# Patient Record
Sex: Female | Born: 1985
Health system: Southern US, Community
[De-identification: ages and names within clinical notes are randomized; demographics above are authoritative.]

## PROBLEM LIST (undated history)

## (undated) DIAGNOSIS — F32A Depression, unspecified: Secondary | ICD-10-CM

## (undated) DIAGNOSIS — K219 Gastro-esophageal reflux disease without esophagitis: Secondary | ICD-10-CM

## (undated) DIAGNOSIS — T8859XA Other complications of anesthesia, initial encounter: Secondary | ICD-10-CM

## (undated) DIAGNOSIS — F329 Major depressive disorder, single episode, unspecified: Secondary | ICD-10-CM

## (undated) DIAGNOSIS — Z8489 Family history of other specified conditions: Secondary | ICD-10-CM

## (undated) DIAGNOSIS — F419 Anxiety disorder, unspecified: Secondary | ICD-10-CM

## (undated) DIAGNOSIS — Z973 Presence of spectacles and contact lenses: Secondary | ICD-10-CM

## (undated) HISTORY — PX: WISDOM TOOTH EXTRACTION: SHX21

---
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
## 2008-05-31 ENCOUNTER — Emergency Department (HOSPITAL_COMMUNITY): Admission: EM | Admit: 2008-05-31 | Discharge: 2008-05-31 | Payer: Self-pay | Admitting: Family Medicine

## 2009-05-25 ENCOUNTER — Emergency Department (HOSPITAL_COMMUNITY): Admission: EM | Admit: 2009-05-25 | Discharge: 2009-05-25 | Payer: Self-pay | Admitting: Family Medicine

## 2010-10-15 ENCOUNTER — Inpatient Hospital Stay (INDEPENDENT_AMBULATORY_CARE_PROVIDER_SITE_OTHER)
Admission: RE | Admit: 2010-10-15 | Discharge: 2010-10-15 | Disposition: A | Discharge status: Home / Self Care | Payer: 59 | Source: Ambulatory Visit | Attending: Family Medicine | Admitting: Family Medicine

## 2010-10-15 DIAGNOSIS — J069 Acute upper respiratory infection, unspecified: Secondary | ICD-10-CM

## 2010-10-15 LAB — POCT RAPID STREP A (OFFICE): Streptococcus, Group A Screen (Direct): NEGATIVE

## 2010-11-27 LAB — URINE CULTURE

## 2010-11-27 LAB — POCT URINALYSIS DIP (DEVICE)
Bilirubin Urine: NEGATIVE
Glucose, UA: NEGATIVE mg/dL
Nitrite: NEGATIVE
Urobilinogen, UA: 0.2 mg/dL (ref 0.0–1.0)

## 2013-04-22 ENCOUNTER — Encounter (HOSPITAL_COMMUNITY): Payer: Self-pay

## 2013-04-22 ENCOUNTER — Emergency Department (HOSPITAL_COMMUNITY)
Admission: EM | Admit: 2013-04-22 | Discharge: 2013-04-22 | Disposition: A | Discharge status: Home / Self Care | Payer: 59 | Source: Home / Self Care | Attending: Emergency Medicine | Admitting: Emergency Medicine

## 2013-04-22 DIAGNOSIS — H6123 Impacted cerumen, bilateral: Secondary | ICD-10-CM

## 2013-04-22 DIAGNOSIS — H6120 Impacted cerumen, unspecified ear: Secondary | ICD-10-CM

## 2013-04-22 HISTORY — DX: Major depressive disorder, single episode, unspecified: F32.9

## 2013-04-22 HISTORY — DX: Depression, unspecified: F32.A

## 2013-04-22 NOTE — ED Provider Notes (Signed)
CSN: [id number]     Arrival date & time 04/22/13  1347 History   First MD Initiated Contact with Patient 04/22/13 1506     Chief Complaint  Patient presents with  . Cerumen Impaction   (Consider location/radiation/quality/duration/timing/severity/associated sxs/prior Treatment) The history is provided by the patient. No language interpreter was used.  C/O OF HEARING LOSS AND EAR STUFFINESS ON THE LEFT X SEVERAL DAYS HX OF CERUMEN IMPACTION IN THE PAST NO TRAUMA, NO FEVER  Past Medical History  Diagnosis Date  . Depression    History reviewed. No pertinent past surgical history. History reviewed. No pertinent family history. History  Substance Use Topics  . Smoking status: Current Some Day Smoker  . Smokeless tobacco: Not on file  . Alcohol Use: Yes   OB History   Grav Para Term Preterm Abortions TAB SAB Ect Mult Living                 Review of Systems  Constitutional: Negative.   HENT: Negative.        C/O LEFT EAR STUFFINESS AND HEARING LOSS  Eyes: Negative.   Respiratory: Negative.   Cardiovascular: Negative.   Gastrointestinal: Negative.   Endocrine: Negative.   Genitourinary: Negative.   Musculoskeletal: Negative.   Neurological: Negative.   Hematological: Negative.   Psychiatric/Behavioral: Negative.   All other systems reviewed and are negative.    Allergies  Review of patient's allergies indicates no known allergies.  Home Medications   Current Outpatient Rx  Name  Route  Sig  Dispense  Refill  . FLUoxetine (PROZAC) 20 MG capsule   Oral   Take 20 mg by mouth daily.          BP 117/77  Pulse 78  Temp(Src) 98.5 F (36.9 C) (Oral)  Resp 10  SpO2 98% Physical Exam  Nursing note and vitals reviewed. Constitutional: She is oriented to person, place, and time. She appears well-developed and well-nourished.  HENT:  Head: Normocephalic and atraumatic.  Mouth/Throat: Oropharynx is clear and moist.  LEFT EAR-NORMAL PINNA  IMPACTED CERUMEN RT EAR -PARTIAL CERUMEN IMPACTION  Eyes: Conjunctivae are normal. Pupils are equal, round, and reactive to light.  Neck: Normal range of motion. Neck supple.  Cardiovascular: Normal rate, regular rhythm, normal heart sounds and intact distal pulses.   No murmur heard. Pulmonary/Chest: Effort normal and breath sounds normal.  Abdominal: Soft. Bowel sounds are normal. She exhibits no distension and no mass. There is no tenderness.  Musculoskeletal: Normal range of motion.  Neurological: She is alert and oriented to person, place, and time. No cranial nerve deficit. She exhibits normal muscle tone. Coordination normal.  Skin: Skin is warm and dry.  Psychiatric: She has a normal mood and affect.    ED Course  Procedures (including critical care time) Labs Review Labs Reviewed - No data to display Imaging Review No results found.  MDM   1. Cerumen impaction, bilateral        Jani Files, MD 04/22/13 [id number]

## 2013-04-22 NOTE — ED Notes (Signed)
C/o hearing loss due to wax impaction ( visible on inspection)

## 2015-09-05 DIAGNOSIS — R87612 Low grade squamous intraepithelial lesion on cytologic smear of cervix (LGSIL): Secondary | ICD-10-CM | POA: Diagnosis not present

## 2015-10-04 DIAGNOSIS — Z13220 Encounter for screening for lipoid disorders: Secondary | ICD-10-CM | POA: Diagnosis not present

## 2015-10-04 DIAGNOSIS — F419 Anxiety disorder, unspecified: Secondary | ICD-10-CM | POA: Diagnosis not present

## 2015-10-04 DIAGNOSIS — Z0000 Encounter for general adult medical examination without abnormal findings: Secondary | ICD-10-CM | POA: Diagnosis not present

## 2015-10-04 DIAGNOSIS — Z808 Family history of malignant neoplasm of other organs or systems: Secondary | ICD-10-CM | POA: Diagnosis not present

## 2015-11-15 DIAGNOSIS — F419 Anxiety disorder, unspecified: Secondary | ICD-10-CM | POA: Diagnosis not present

## 2015-11-29 DIAGNOSIS — F419 Anxiety disorder, unspecified: Secondary | ICD-10-CM | POA: Diagnosis not present

## 2015-12-31 DIAGNOSIS — F419 Anxiety disorder, unspecified: Secondary | ICD-10-CM | POA: Diagnosis not present

## 2016-08-10 DIAGNOSIS — H9202 Otalgia, left ear: Secondary | ICD-10-CM | POA: Diagnosis not present

## 2016-10-06 DIAGNOSIS — Z0000 Encounter for general adult medical examination without abnormal findings: Secondary | ICD-10-CM | POA: Diagnosis not present

## 2016-10-06 DIAGNOSIS — F419 Anxiety disorder, unspecified: Secondary | ICD-10-CM | POA: Diagnosis not present

## 2016-10-06 DIAGNOSIS — Z13220 Encounter for screening for lipoid disorders: Secondary | ICD-10-CM | POA: Diagnosis not present

## 2016-10-28 DIAGNOSIS — H52223 Regular astigmatism, bilateral: Secondary | ICD-10-CM | POA: Diagnosis not present

## 2016-10-28 DIAGNOSIS — H5213 Myopia, bilateral: Secondary | ICD-10-CM | POA: Diagnosis not present

## 2016-11-19 MED FILL — SERTRALINE HCL 50 MG TABLET: 50 | 30 days supply | Qty: 30 | Fill #0

## 2016-12-03 DIAGNOSIS — Z6820 Body mass index (BMI) 20.0-20.9, adult: Secondary | ICD-10-CM | POA: Diagnosis not present

## 2016-12-03 DIAGNOSIS — Z01419 Encounter for gynecological examination (general) (routine) without abnormal findings: Secondary | ICD-10-CM | POA: Diagnosis not present

## 2016-12-03 MED FILL — TRI-PREVIFEM TABLET: 0.18/0.215/ | 28 days supply | Qty: 28 | Fill #0

## 2016-12-17 MED FILL — SERTRALINE HCL 50 MG TABLET: 50 | 30 days supply | Qty: 30 | Fill #1

## 2017-01-01 MED FILL — TRI-PREVIFEM TABLET: 0.18/0.215/ | 84 days supply | Qty: 84 | Fill #1

## 2017-01-12 MED FILL — NEO/POLYMYXIN/DEXAMETH DROP: 3.5-10000-0 | 10 days supply | Qty: 5 | Fill #0

## 2017-01-12 MED FILL — SERTRALINE HCL 50 MG TABLET: 50 | 90 days supply | Qty: 90 | Fill #2

## 2017-03-25 MED FILL — LORazepam 0.5 MG TABS: 0.5 | 30 days supply | Qty: 30 | Fill #0

## 2017-04-02 MED FILL — SERTRALINE HCL 50 MG TABLET: 50 | 90 days supply | Qty: 90 | Fill #0

## 2017-04-02 MED FILL — TRI-PREVIFEM TABLET: 0.18/0.215/ | 84 days supply | Qty: 84 | Fill #2

## 2017-05-13 ENCOUNTER — Other Ambulatory Visit: Payer: Self-pay | Admitting: Occupational Medicine

## 2017-05-13 ENCOUNTER — Ambulatory Visit: Payer: Self-pay

## 2017-05-13 DIAGNOSIS — M542 Cervicalgia: Secondary | ICD-10-CM

## 2017-07-06 ENCOUNTER — Other Ambulatory Visit: Payer: Self-pay | Admitting: Family Medicine

## 2017-07-06 ENCOUNTER — Ambulatory Visit
Admission: RE | Admit: 2017-07-06 | Discharge: 2017-07-06 | Disposition: A | Discharge status: Home / Self Care | Payer: 59 | Source: Ambulatory Visit | Attending: Family Medicine | Admitting: Family Medicine

## 2017-07-06 DIAGNOSIS — M79671 Pain in right foot: Secondary | ICD-10-CM

## 2017-07-06 MED FILL — NORG-EE 0.18-0.215-0.25/0.0: 0.18/0.215/ | 84 days supply | Qty: 84 | Fill #3

## 2017-07-08 ENCOUNTER — Encounter (INDEPENDENT_AMBULATORY_CARE_PROVIDER_SITE_OTHER): Payer: Self-pay | Admitting: Orthopedic Surgery

## 2017-07-08 ENCOUNTER — Ambulatory Visit (INDEPENDENT_AMBULATORY_CARE_PROVIDER_SITE_OTHER): Payer: 59 | Admitting: Orthopedic Surgery

## 2017-07-08 DIAGNOSIS — M79671 Pain in right foot: Secondary | ICD-10-CM | POA: Diagnosis not present

## 2017-07-08 NOTE — Progress Notes (Signed)
   Office Visit Note   Patient: Patricia Knight           Date of Birth: November 28, 1985           MRN: [id number] Visit Date: 07/08/2017              Requested by: Deatra JamesSun, Vyvyan, MD [phone] [address] [place], [place] [address] PCP: Deatra JamesSun, Vyvyan, MD  No chief complaint on file.     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  Patient does work 12-hour shifts on her foot she denies any specific injury.  She did have radiographs obtained on 07/06/2017 these are reviewed which shows no fractures.  Patient is currently in a pair of soft sneakers and presents for initial evaluation.  Assessment & Plan: Visit Diagnoses:  1. Pain in right foot   Stress fracture base of the fourth metatarsal right foot.  Plan: Recommended a stiff soled sneaker that does not flex.  Recommended out of work for 1 week and out of exercise and training for 3 weeks follow-up in 3 weeks with 3 view radiographs of the right foot at follow-up.  Follow-Up Instructions: Return in about 3 weeks (around 07/29/2017).   Ortho Exam  Patient is alert, oriented, no adenopathy, well-dressed, normal affect, normal respiratory effort. Examination patient has a good dorsalis pedis pulse she has good ankle good subtalar motion.  The metatarsal heads are nontender to palpation the web spaces are nontender to palpation she is point tender to palpation over the base of the fourth metatarsal.  Radiographs are reviewed which shows no fracture no cystic changes.  Imaging: No results found. No images are attached to the encounter.  Labs: Lab Results  Component Value Date   REPTSTATUS 05/28/2009 FINAL 05/25/2009   CULT ESCHERICHIA COLI 05/25/2009   LABORGA ESCHERICHIA COLI 05/25/2009    Orders:  No orders of the defined types were placed in this encounter.  No orders of the  defined types were placed in this encounter.    Procedures: No procedures performed  Clinical Data: No additional findings.  ROS:  All other systems negative, except as noted in the HPI. Review of Systems  Objective: Vital Signs: LMP 06/13/2017   Specialty Comments:  No specialty comments available.  PMFS History: There are no active problems to display for this patient.  Past Medical History:  Diagnosis Date  . Depression     History reviewed. No pertinent family history.  History reviewed. No pertinent surgical history. Social History   Occupational History  . Not on file  Tobacco Use  . Smoking status: Current Some Day Smoker  Substance and Sexual Activity  . Alcohol use: Yes  . Drug use: No  . Sexual activity: Yes    Birth control/protection: Pill

## 2017-07-21 MED FILL — SERTRALINE HCL 50 MG TABLET: 50 | 90 days supply | Qty: 90 | Fill #0

## 2017-08-03 ENCOUNTER — Ambulatory Visit (INDEPENDENT_AMBULATORY_CARE_PROVIDER_SITE_OTHER): Payer: 59 | Admitting: Orthopedic Surgery

## 2017-08-03 ENCOUNTER — Encounter (INDEPENDENT_AMBULATORY_CARE_PROVIDER_SITE_OTHER): Payer: Self-pay | Admitting: Orthopedic Surgery

## 2017-08-03 ENCOUNTER — Ambulatory Visit (INDEPENDENT_AMBULATORY_CARE_PROVIDER_SITE_OTHER): Payer: 59

## 2017-08-03 DIAGNOSIS — S92344D Nondisplaced fracture of fourth metatarsal bone, right foot, subsequent encounter for fracture with routine healing: Secondary | ICD-10-CM

## 2017-08-03 NOTE — Progress Notes (Signed)
   Office Visit Note   Patient: Patricia Knight           Date of Birth: [birth year]           MRN: [id number] Visit Date: 08/03/2017              Requested by: Deatra JamesSun, Vyvyan, MD [phone] [address] [place], [place] [address] PCP: Deatra JamesSun, Vyvyan, MD  Chief Complaint  Patient presents with  . Right Foot - Follow-up      HPI: Patient presents in follow-up for stress fracture of fourth metatarsal right foot.  She has been in a fracture boot for about 2 weeks she states she is symptomatically much better she states she could not tolerate a stiff soled shoe.  She states her symptoms are worse after working a 12-hour shift.  She states she is much better now than she was previously.  Assessment & Plan: Visit Diagnoses:  1. Closed nondisplaced fracture of fourth metatarsal bone of right foot with routine healing, subsequent encounter     Plan: Patient will wear the fracture boot for 2 more weeks and then advance her activities as tolerated with nonimpact exercises such as elliptical or cycling.  If she develops symptoms at that time she will decrease her activities as per her symptoms.  Follow-Up Instructions: Return if symptoms worsen or fail to improve.   Ortho Exam  Patient is alert, oriented, no adenopathy, well-dressed, normal affect, normal respiratory effort. Examination patient has no redness no swelling no signs of infection in the foot.  She has good pulses.  She has no tenderness to palpation over the metatarsals the web spaces are nontender no evidence of a neuroma.  No ecchymosis no bruising no skin color or temperature changes.  Imaging: Xr Foot Complete Right  Result Date: 08/03/2017 3 view radiographs of the right foot shows normal bony alignment no callus formation no displaced fractures.  No images are attached to the encounter.  Labs: Lab Results  Component Value Date   REPTSTATUS 05/28/2009 FINAL 05/25/2009   CULT ESCHERICHIA COLI 05/25/2009   LABORGA  ESCHERICHIA COLI 05/25/2009    @LABSALLVALUES (HGBA1)@  There is no height or weight on file to calculate BMI.  Orders:  Orders Placed This Encounter  Procedures  . XR Foot Complete Right   No orders of the defined types were placed in this encounter.    Procedures: No procedures performed  Clinical Data: No additional findings.  ROS:  All other systems negative, except as noted in the HPI. Review of Systems  Objective: Vital Signs: There were no vitals taken for this visit.  Specialty Comments:  No specialty comments available.  PMFS History: There are no active problems to display for this patient.  Past Medical History:  Diagnosis Date  . Depression     History reviewed. No pertinent family history.  History reviewed. No pertinent surgical history. Social History   Occupational History  . Not on file  Tobacco Use  . Smoking status: Current Some Day Smoker  . Smokeless tobacco: Never Used  Substance and Sexual Activity  . Alcohol use: Yes  . Drug use: No  . Sexual activity: Yes    Birth control/protection: Pill

## 2017-09-29 MED FILL — OSELTAMIVIR PHOSPHATE 75 MG: 75 | 5 days supply | Qty: 10 | Fill #0

## 2017-09-29 MED FILL — NORG-EE 0.18-0.215-0.25/0.0: 0.18/0.215/ | 84 days supply | Qty: 84 | Fill #4

## 2017-10-08 MED FILL — metroNIDAZOLE 0.75 % CREA: 0.75 | 20 days supply | Qty: 45 | Fill #0

## 2017-10-08 MED FILL — SERTRALINE HCL 50 MG TABLET: 50 | 90 days supply | Qty: 90 | Fill #0

## 2017-11-05 ENCOUNTER — Encounter: Payer: Self-pay | Admitting: Podiatry

## 2017-11-05 ENCOUNTER — Ambulatory Visit: Payer: No Typology Code available for payment source | Admitting: Podiatry

## 2017-11-05 ENCOUNTER — Ambulatory Visit (INDEPENDENT_AMBULATORY_CARE_PROVIDER_SITE_OTHER): Payer: No Typology Code available for payment source

## 2017-11-05 VITALS — BP 127/88 | HR 65 | Resp 16

## 2017-11-05 DIAGNOSIS — M7751 Other enthesopathy of right foot: Secondary | ICD-10-CM

## 2017-11-05 DIAGNOSIS — M779 Enthesopathy, unspecified: Secondary | ICD-10-CM | POA: Diagnosis not present

## 2017-11-05 DIAGNOSIS — M778 Other enthesopathies, not elsewhere classified: Secondary | ICD-10-CM

## 2017-11-05 MED ORDER — TRIAMCINOLONE ACETONIDE 10 MG/ML IJ SUSP
10.0000 mg | Freq: Once | INTRAMUSCULAR | Status: AC
  Administered 2017-11-05: 10 mg

## 2017-11-05 NOTE — Progress Notes (Signed)
Subjective:   Patient ID: Patricia Knight, female   DOB: 32 y.o.   MRN: [id number]   HPI Patient presents stating that she developed a lot of pain in her forefoot right and she does not remember specific injury but is been going on for around 4 months.  Patient was given a tentative diagnosis of stress fracture walker boot for 8 weeks but that has not improved problems and it now hurts in her ankle.  Patient does not smoke and likes to be active   Review of Systems  All other systems reviewed and are negative.       Objective:  Physical Exam  Constitutional: She appears well-developed and well-nourished.  Cardiovascular: Intact distal pulses.  Pulmonary/Chest: Effort normal.  Musculoskeletal: Normal range of motion.  Neurological: She is alert.  Skin: Skin is warm.  Nursing note and vitals reviewed.   Neurovascular status intact muscle strength adequate range of motion within normal limits with exquisite discomfort in the mid foot right within the third intermetatarsal space and around the third and fourth metatarsals.  It is localized to this area with exquisite discomfort also noted in the sinus tarsi right and no indications of flatfoot deformity or other structural pathology.  Patient has good digital perfusion and is well oriented x3     Assessment:  Appears to be an inflammatory condition that is most likely soft tissue versus versus bone.  Patient does have an unusual area that it hurts but it is quite tender in this area indicative of an inflammatory condition     Plan:  H&P x-rays reviewed and today careful midfoot injection administered 3 mg Kenalog 5 mg Xylocaine and sinus tarsi injection administered 3 mg Kenalog 5 mg Xylocaine and discussed that if this does not improve we will need to get MRI at this to evaluate whether or not there is another condition occurring.  Patient also was placed in fascial brace to lift the arch and will be evaluated in the next several  weeks  X-rays were negative for signs of fracture and it appears to be some kind of a soft tissue condition

## 2017-11-22 MED FILL — metroNIDAZOLE 0.75 % CREA: 0.75 | 20 days supply | Qty: 45 | Fill #0

## 2017-11-24 ENCOUNTER — Encounter: Payer: Self-pay | Admitting: Podiatry

## 2017-11-24 ENCOUNTER — Ambulatory Visit: Payer: No Typology Code available for payment source | Admitting: Podiatry

## 2017-11-24 ENCOUNTER — Telehealth: Payer: Self-pay | Admitting: Podiatry

## 2017-11-24 DIAGNOSIS — M779 Enthesopathy, unspecified: Secondary | ICD-10-CM | POA: Diagnosis not present

## 2017-11-24 NOTE — Progress Notes (Signed)
Subjective:   Patient ID: Patricia Knight, female   DOB: 32 y.o.   MRN: [id number]   HPI Patient states that she is improved and states that she still has some discomfort in the area and also that her toes turn colors quite frequently   ROS      Objective:  Physical Exam  Neurovascular status intact with patient found to have possibility for ray nods type phenomena bilateral and is noted to have mild to moderate discomfort still in the forefoot right foot     Assessment:  Improving right foot with possibility for mild chronic pain syndrome and also ray nods may be present     Plan:  With patient today to be low at 10 history of present recommended long-term orthotics and I did refer to ped orthotist for this procedure.  I explained that I want them to be a softer type device due to her activity levels and will have those made and also due to the discomfort and the possibility for chronic pain syndrome I am sending her to physical therapy for e-stim iontophoresis and joint mobilization to try to continue to improve the condition she is experiencing.  Spent a great deal time educating her on this

## 2017-11-24 NOTE — Telephone Encounter (Signed)
Left voicemail for pt to call to discuss appt for 4.10.19 for custom orthotics.Not covered under focus plan.

## 2017-11-25 NOTE — Addendum Note (Signed)
Addended by: Alphia Kava'CONNELL, VALERY D on: 11/25/2017 06:05 PM   Modules accepted: Orders

## 2017-12-01 ENCOUNTER — Ambulatory Visit (INDEPENDENT_AMBULATORY_CARE_PROVIDER_SITE_OTHER): Payer: No Typology Code available for payment source | Admitting: Orthotics

## 2017-12-01 DIAGNOSIS — M779 Enthesopathy, unspecified: Secondary | ICD-10-CM

## 2017-12-01 DIAGNOSIS — M778 Other enthesopathies, not elsewhere classified: Secondary | ICD-10-CM

## 2017-12-01 NOTE — Progress Notes (Signed)
Patient came into today to be cast for Custom Foot Orthotics. Upon recommendation of Dr. 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

## 2017-12-23 ENCOUNTER — Ambulatory Visit: Payer: No Typology Code available for payment source | Admitting: Orthotics

## 2017-12-23 DIAGNOSIS — M779 Enthesopathy, unspecified: Secondary | ICD-10-CM

## 2017-12-23 DIAGNOSIS — M778 Other enthesopathies, not elsewhere classified: Secondary | ICD-10-CM

## 2017-12-23 NOTE — Progress Notes (Signed)
Patient came in today to pick up custom made foot orthotics.  The goals were accomplished and the patient reported no dissatisfaction with said orthotics.  Patient was advised of breakin period and how to report any issues. 

## 2017-12-30 MED FILL — metroNIDAZOLE 0.75 % CREA: 0.75 | 20 days supply | Qty: 45 | Fill #1

## 2018-01-06 MED FILL — FEMYNOR 0.25-35 MG-MCG TABS: 0.25-35 | 21 days supply | Qty: 28 | Fill #0

## 2018-01-07 MED FILL — SERTRALINE HCL 50 MG TABLET: 50 | 90 days supply | Qty: 90 | Fill #1

## 2018-01-31 MED FILL — FEMYNOR 0.25-35 MG-MCG TABS: 0.25-35 | 84 days supply | Qty: 112 | Fill #1

## 2018-03-09 ENCOUNTER — Ambulatory Visit (INDEPENDENT_AMBULATORY_CARE_PROVIDER_SITE_OTHER): Payer: Self-pay | Admitting: Nurse Practitioner

## 2018-03-09 VITALS — BP 105/65 | HR 67 | Temp 98.5°F | Resp 16 | Wt 124.2 lb

## 2018-03-09 DIAGNOSIS — L259 Unspecified contact dermatitis, unspecified cause: Secondary | ICD-10-CM

## 2018-03-09 MED ORDER — HYDROCORTISONE 2.5 % EX OINT: TOPICAL_OINTMENT | Freq: Two times a day (BID) | CUTANEOUS | 0 refills | Status: DC

## 2018-03-09 MED ORDER — PREDNISONE 10 MG (21) PO TBPK: ORAL_TABLET | ORAL | 0 refills | Status: AC

## 2018-03-09 NOTE — Patient Instructions (Signed)
Contact Dermatitis  Take Pepcid AC 20mg  daily until symptoms improve Take Zyrtec 10mg  daily in the morning until symptoms improve. Take Benadryl 25mg  at bedtime until symptoms improve.  Dermatitis is redness, soreness, and swelling (inflammation) of the skin. Contact dermatitis is a reaction to certain substances that touch the skin. There are two types of contact dermatitis:  Irritant contact dermatitis. This type is caused by something that irritates your skin, such as dry hands from washing them too much. This type does not require previous exposure to the substance for a reaction to occur. This type is more common.  Allergic contact dermatitis. This type is caused by a substance that you are allergic to, such as a nickel allergy or poison ivy. This type only occurs if you have been exposed to the substance (allergen) before. Upon a repeat exposure, your body reacts to the substance. This type is less common.  What are the causes? Many different substances can cause contact dermatitis. Irritant contact dermatitis is most commonly caused by exposure to:  Makeup.  Soaps.  Detergents.  Bleaches.  Acids.  Metal salts, such as nickel.  Allergic contact dermatitis is most commonly caused by exposure to:  Poisonous plants.  Chemicals.  Jewelry.  Latex.  Medicines.  Preservatives in products, such as clothing.  What increases the risk? This condition is more likely to develop in:  People who have jobs that expose them to irritants or allergens.  People who have certain medical conditions, such as asthma or eczema.  What are the signs or symptoms? Symptoms of this condition may occur anywhere on your body where the irritant has touched you or is touched by you. Symptoms include:  Dryness or flaking.  Redness.  Cracks.  Itching.  Pain or a burning feeling.  Blisters.  Drainage of small amounts of blood or clear fluid from skin cracks.  With allergic contact  dermatitis, there may also be swelling in areas such as the eyelids, mouth, or genitals. How is this diagnosed? This condition is diagnosed with a medical history and physical exam. A patch skin test may be performed to help determine the cause. If the condition is related to your job, you may need to see an occupational medicine specialist. How is this treated? Treatment for this condition includes figuring out what caused the reaction and protecting your skin from further contact. Treatment may also include:  Steroid creams or ointments. Oral steroid medicines may be needed in more severe cases.  Antibiotics or antibacterial ointments, if a skin infection is present.  Antihistamine lotion or an antihistamine taken by mouth to ease itching.  A bandage (dressing).  Follow these instructions at home: Skin Care  Moisturize your skin as needed.  Apply cool compresses to the affected areas.  Try taking a bath with: ? Epsom salts. Follow the instructions on the packaging. You can get these at your local pharmacy or grocery store. ? Baking soda. Pour a small amount into the bath as directed by your health care provider. ? Colloidal oatmeal. Follow the instructions on the packaging. You can get this at your local pharmacy or grocery store.  Try applying baking soda paste to your skin. Stir water into baking soda until it reaches a paste-like consistency.  Do not scratch your skin.  Bathe less frequently, such as every other day.  Bathe in lukewarm water. Avoid using hot water. Medicines  Take or apply over-the-counter and prescription medicines only as told by your health care provider.  If  you were prescribed an antibiotic medicine, take or apply your antibiotic as told by your health care provider. Do not stop using the antibiotic even if your condition starts to improve. General instructions  Keep all follow-up visits as told by your health care provider. This is  important.  Avoid the substance that caused your reaction. If you do not know what caused it, keep a journal to try to track what caused it. Write down: ? What you eat. ? What cosmetic products you use. ? What you drink. ? What you wear in the affected area. This includes jewelry.  If you were given a dressing, take care of it as told by your health care provider. This includes when to change and remove it. Contact a health care provider if:  Your condition does not improve with treatment.  Your condition gets worse.  You have signs of infection such as swelling, tenderness, redness, soreness, or warmth in the affected area.  You have a fever.  You have new symptoms. Get help right away if:  You have a severe headache, neck pain, or neck stiffness.  You vomit.  You feel very sleepy.  You notice red streaks coming from the affected area.  Your bone or joint underneath the affected area becomes painful after the skin has healed.  The affected area turns darker.  You have difficulty breathing. This information is not intended to replace advice given to you by your health care provider. Make sure you discuss any questions you have with your health care provider. Document Released: 08/07/2000 Document Revised: 01/16/2016 Document Reviewed: 12/26/2014 Elsevier Interactive Patient Education  2018 [employer].

## 2018-03-09 NOTE — Progress Notes (Signed)
Subjective:     Sampson Goonina E Delsanto is a 32 y.o. female who presents for evaluation of a rash involving the chest and neck, behind her ears and bilateral forearms. Rash started 1 day ago. Lesions are erythematous, and raised in texture. Rash has changed over time. Rash is pruritic. Associated symptoms: none. Patient denies: abdominal pain, cough, fever, headache, irritability, myalgia, nausea, sore throat and vomiting. Patient has not had contacts with similar rash. Patient has had new exposures (soaps, lotions, laundry detergents, foods, medications, plants, insects or animals).  The only thing that she has changed over the last couple of days is that she and her husband purchased a new hot tub and suspects the chemicals in the water may have caused the rash.  She has not taken anything for the rash at this time.  The following portions of the patient's history were reviewed and updated as appropriate: allergies, current medications and past medical history.  Review of Systems Constitutional: negative Ears, nose, mouth, throat, and face: negative Respiratory: negative Cardiovascular: negative Integument/breast: positive for pruritus, rash and skin color change, negative for skin lesion(s)    Objective:    BP 105/65 (BP Location: Right Arm, Patient Position: Sitting, Cuff Size: Normal)   Pulse 67   Temp 98.5 F (36.9 C)   Resp 16   Wt 124 lb 3.2 oz (56.3 kg)   SpO2 98%  General:  alert, cooperative, mild distress and due to itching  Skin:  maculopapular rash to chest, arms, neck and behind ears, rash is erythematous, raised, no drainage     Assessment:    contact dermatitis: Unknown Agent    Plan:   Exam findings, diagnosis etiology and medication use and indications reviewed with patient. Follow- Up and discharge instructions provided. No emergent/urgent issues found on exam.  Patient verbalized understanding of information provided and agrees with plan of care (POC), all questions  answered.  Contact Dermatitis, Unknown Agent -Take Pepcid AC 20mg  daily until symptoms improve -Take Zyrtec 10mg  daily in the morning until symptoms improve. -Take Benadryl 25mg  at bedtime until symptoms improve. -Sterapred Dose Pack as directed. -Hydrocortisone cream as directed. -Use Aveeno Colloidal Oatmeal bath until symptoms improve. 1. Contact dermatitis, unspecified contact dermatitis type, unspecified trigger  - predniSONE (STERAPRED UNI-PAK 21 TAB) 10 MG (21) TBPK tablet; Take as directed.  Dispense: 21 tablet; Refill: 0 - hydrocortisone 2.5 % ointment; Apply topically 2 (two) times daily. Apply to affected areas twice daily until symptoms improve.  Dispense: 28.35 g; Refill: 0

## 2018-05-03 MED FILL — SERTRALINE HCL 50 MG TABLET: 50 | 90 days supply | Qty: 90 | Fill #2

## 2018-05-04 MED FILL — FOLIC ACID 1 MG TABS: 1 | 90 days supply | Qty: 90 | Fill #0

## 2018-06-03 ENCOUNTER — Ambulatory Visit: Payer: No Typology Code available for payment source | Admitting: Podiatry

## 2018-06-03 ENCOUNTER — Encounter: Payer: Self-pay | Admitting: Podiatry

## 2018-06-03 ENCOUNTER — Ambulatory Visit: Payer: No Typology Code available for payment source

## 2018-06-03 DIAGNOSIS — M779 Enthesopathy, unspecified: Secondary | ICD-10-CM | POA: Diagnosis not present

## 2018-06-03 DIAGNOSIS — M778 Other enthesopathies, not elsewhere classified: Secondary | ICD-10-CM

## 2018-06-03 DIAGNOSIS — G894 Chronic pain syndrome: Secondary | ICD-10-CM

## 2018-06-03 DIAGNOSIS — M79671 Pain in right foot: Secondary | ICD-10-CM

## 2018-06-03 MED ORDER — TRIAMCINOLONE ACETONIDE 10 MG/ML IJ SUSP
10.0000 mg | Freq: Once | INTRAMUSCULAR | Status: AC
  Administered 2018-06-03: 10 mg

## 2018-06-03 MED ORDER — GABAPENTIN 100 MG PO CAPS: 100.0000 mg | ORAL_CAPSULE | Freq: Three times a day (TID) | ORAL | 3 refills | Status: DC

## 2018-06-03 NOTE — Progress Notes (Signed)
Subjective:   Patient ID: Patricia Knight, female   DOB: 32 y.o.   MRN: [id number]   HPI Patient states that last weekend she started to develop pain again and its in the top of the foot and she is trying to get pregnant most likely is not but is due to get her cycle in the next few days.  Patient stated for the most part her foot was feeling better over this last 6 months   ROS      Objective:  Physical Exam  Neurovascular status intact with still some coolness to the foot in general with mild mottled-like appearance and discomfort dorsum of the right foot     Assessment:  Cannot rule out chronic pain syndrome versus possibility for tendinitis-like symptoms or other soft tissue bone structural deformity     Plan:  H&P condition reviewed and careful dorsal injection administered 3 mg Kenalog 5 g Xylocaine and we will try a topical neuropathic type cream and I did discuss possible gabapentin assuming she is not pregnant and we will see her back 5 weeks and reevaluate

## 2018-06-06 ENCOUNTER — Telehealth: Payer: Self-pay | Admitting: Podiatry

## 2018-06-06 NOTE — Telephone Encounter (Addendum)
Pt states her ob-gyn states she can not use the gabapentin or the topical. Pt states she is trying to get pregnant. I told pt that she could use ice therapy 15-20 minutes/sessions 3-4 times a day protecting the skin from the ice with a light cloth. Pt states understanding. Pt states the right orthotics is fine and she can not feel in her shoe, but the left she can feel in the shoe and she was told that she could come in to get it adjusted. I told pt I would transfer to [facility] - Music therapist and she would help her with a good time to come in.

## 2018-06-06 NOTE — Telephone Encounter (Signed)
Pt. Called to say, shes unable to take either medication you prescribed. My foot feels about 90% better just from the steroid injection.

## 2018-06-13 ENCOUNTER — Ambulatory Visit: Payer: No Typology Code available for payment source | Admitting: Orthotics

## 2018-06-13 DIAGNOSIS — M779 Enthesopathy, unspecified: Secondary | ICD-10-CM

## 2018-06-13 DIAGNOSIS — M778 Other enthesopathies, not elsewhere classified: Secondary | ICD-10-CM

## 2018-06-13 NOTE — Progress Notes (Signed)
Made adjustment to RT foot orthotic...lowered arch manually with heat/and cool air.  Patietn was pleased w/ result.

## 2018-07-01 ENCOUNTER — Encounter: Payer: Self-pay | Admitting: Podiatry

## 2018-07-08 ENCOUNTER — Ambulatory Visit: Payer: No Typology Code available for payment source | Admitting: Podiatry

## 2018-08-19 MED FILL — FOLIC ACID 1 MG TABS: 1 | 90 days supply | Qty: 90 | Fill #1

## 2018-08-19 MED FILL — SERTRALINE HCL 50 MG TABLET: 50 | 90 days supply | Qty: 90 | Fill #3

## 2018-08-19 MED FILL — metroNIDAZOLE 0.75 % CREA: 0.75 | 20 days supply | Qty: 45 | Fill #2

## 2018-08-24 NOTE — L&D Delivery Note (Signed)
Delivery Note At 2:09 PM Patricia viable and healthy female was delivered via Vaginal, Spontaneous (Presentation:vtx roa ;  ).  APGAR: 8, 9; weight  pending.   Placenta status: spontaneous, intact not sent, .  Cord: n/Patricia with the following complications: .  Cord pH: n/Patricia  Anesthesia:  Epidural, local Episiotomy: None Lacerations:  Left vaginal Sulcus;Perineal;2nd degree Suture Repair: 3.0 chromic Est. Blood Loss (mL): 116  Mom to postpartum.  Baby to Couplet care / Skin to Skin.  Patricia Knight Patricia Knight 07/22/2019, 2:47 PM

## 2018-11-01 MED FILL — metroNIDAZOLE 0.75 % CREA: 0.75 | 20 days supply | Qty: 45 | Fill #3

## 2018-11-24 MED FILL — SERTRALINE HCL 50 MG TABLET: 50 | 90 days supply | Qty: 90 | Fill #0

## 2018-11-24 MED FILL — CEPHALEXIN 500 MG CAPSULE: 500 | 7 days supply | Qty: 28 | Fill #0

## 2018-11-24 MED FILL — FOLIC ACID 1 MG TABLET: 1 | 90 days supply | Qty: 90 | Fill #0

## 2018-12-23 MED FILL — DOXYLAMINE-PYRIDOXINE 10-10: 10-10 | 30 days supply | Qty: 120 | Fill #0

## 2019-01-03 LAB — OB RESULTS CONSOLE RUBELLA ANTIBODY, IGM: Rubella: NON-IMMUNE/NOT IMMUNE

## 2019-01-03 LAB — OB RESULTS CONSOLE HEPATITIS B SURFACE ANTIGEN: Hepatitis B Surface Ag: NEGATIVE

## 2019-01-03 LAB — OB RESULTS CONSOLE ABO/RH: RH Type: POSITIVE

## 2019-01-03 LAB — OB RESULTS CONSOLE GC/CHLAMYDIA
Chlamydia: NEGATIVE
Gonorrhea: NEGATIVE

## 2019-01-03 LAB — OB RESULTS CONSOLE HIV ANTIBODY (ROUTINE TESTING): HIV: NONREACTIVE

## 2019-01-24 MED FILL — ONDANSETRON ODT 4 MG TABLET: 4 | 8 days supply | Qty: 24 | Fill #0

## 2019-01-24 MED FILL — SCOPOLAMINE 1 MG/3DAYS PT72: 1 | 9 days supply | Qty: 3 | Fill #0

## 2019-02-01 MED FILL — DOXYLAMINE-PYRIDOXINE 10-10: 10-10 | 30 days supply | Qty: 120 | Fill #0

## 2019-02-20 MED FILL — PRENAISSANCE PLUS SOFTGEL: 28-1-250 | 30 days supply | Qty: 30 | Fill #0

## 2019-03-09 MED FILL — SERTRALINE HCL 50 MG TABLET: 50 | 90 days supply | Qty: 90 | Fill #0

## 2019-03-09 MED FILL — metroNIDAZOLE 0.75 % CREA: 0.75 | 30 days supply | Qty: 45 | Fill #0

## 2019-03-24 MED FILL — PRENAISSANCE PLUS SOFTGEL: 28-1-250 | 30 days supply | Qty: 30 | Fill #1

## 2019-03-29 MED FILL — DOXYLAMINE-PYRIDOXINE 10-10: 10-10 | 30 days supply | Qty: 120 | Fill #1

## 2019-05-02 MED FILL — PRENAISSANCE PLUS SOFTGEL: 28-1-250 | 30 days supply | Qty: 30 | Fill #2

## 2019-05-12 LAB — OB RESULTS CONSOLE RPR: RPR: NONREACTIVE

## 2019-05-31 MED FILL — SERTRALINE HCL 50 MG TABLET: 50 | 90 days supply | Qty: 90 | Fill #1

## 2019-05-31 MED FILL — DOXYLAMINE-PYRIDOXINE 10-10: 10-10 | 30 days supply | Qty: 120 | Fill #2

## 2019-05-31 MED FILL — PRENAISSANCE PLUS SOFTGEL: 28-1-250 | 30 days supply | Qty: 30 | Fill #3

## 2019-06-27 MED FILL — PRENAISSANCE PLUS SOFTGEL: 28-1-250 | 30 days supply | Qty: 30 | Fill #4

## 2019-06-28 LAB — OB RESULTS CONSOLE GBS: GBS: NEGATIVE

## 2019-07-21 ENCOUNTER — Inpatient Hospital Stay (HOSPITAL_COMMUNITY)
Admission: AD | Admit: 2019-07-21 | Discharge: 2019-07-24 | DRG: 807 | Disposition: A | Discharge status: Home / Self Care | Payer: No Typology Code available for payment source | Attending: Obstetrics and Gynecology | Admitting: Obstetrics and Gynecology

## 2019-07-21 ENCOUNTER — Encounter (HOSPITAL_COMMUNITY): Payer: Self-pay | Admitting: *Deleted

## 2019-07-21 ENCOUNTER — Inpatient Hospital Stay (HOSPITAL_COMMUNITY): Payer: No Typology Code available for payment source | Admitting: Anesthesiology

## 2019-07-21 ENCOUNTER — Other Ambulatory Visit: Payer: Self-pay

## 2019-07-21 ENCOUNTER — Other Ambulatory Visit: Payer: Self-pay | Admitting: Obstetrics and Gynecology

## 2019-07-21 DIAGNOSIS — O365930 Maternal care for other known or suspected poor fetal growth, third trimester, not applicable or unspecified: Principal | ICD-10-CM | POA: Diagnosis present

## 2019-07-21 DIAGNOSIS — Z20828 Contact with and (suspected) exposure to other viral communicable diseases: Secondary | ICD-10-CM | POA: Diagnosis present

## 2019-07-21 DIAGNOSIS — Z3A39 39 weeks gestation of pregnancy: Secondary | ICD-10-CM | POA: Diagnosis not present

## 2019-07-21 DIAGNOSIS — O99344 Other mental disorders complicating childbirth: Secondary | ICD-10-CM | POA: Diagnosis present

## 2019-07-21 DIAGNOSIS — F329 Major depressive disorder, single episode, unspecified: Secondary | ICD-10-CM | POA: Diagnosis present

## 2019-07-21 DIAGNOSIS — Z87891 Personal history of nicotine dependence: Secondary | ICD-10-CM | POA: Diagnosis not present

## 2019-07-21 DIAGNOSIS — O9962 Diseases of the digestive system complicating childbirth: Secondary | ICD-10-CM | POA: Diagnosis present

## 2019-07-21 DIAGNOSIS — K219 Gastro-esophageal reflux disease without esophagitis: Secondary | ICD-10-CM | POA: Diagnosis present

## 2019-07-21 HISTORY — DX: Gastro-esophageal reflux disease without esophagitis: K21.9

## 2019-07-21 LAB — PLATELET COUNT: Platelets: 379 10*3/uL (ref 150–400)

## 2019-07-21 LAB — CBC
HCT: 37.2 % (ref 36.0–46.0)
Hemoglobin: 12 g/dL (ref 12.0–15.0)
MCH: 26.4 pg (ref 26.0–34.0)
MCHC: 32.3 g/dL (ref 30.0–36.0)
MCV: 81.9 fL (ref 80.0–100.0)
Platelets: 328 10*3/uL (ref 150–400)
RBC: 4.54 MIL/uL (ref 3.87–5.11)
RDW: 13.5 % (ref 11.5–15.5)
WBC: 11.1 10*3/uL — ABNORMAL HIGH (ref 4.0–10.5)
nRBC: 0 % (ref 0.0–0.2)

## 2019-07-21 LAB — PROTEIN / CREATININE RATIO, URINE
Creatinine, Urine: 36.86 mg/dL
Total Protein, Urine: <6 mg/dL

## 2019-07-21 LAB — TYPE AND SCREEN
ABO/RH(D): A POS
Antibody Screen: NEGATIVE

## 2019-07-21 LAB — SARS CORONAVIRUS 2 (TAT 6-24 HRS): SARS Coronavirus 2: NEGATIVE

## 2019-07-21 MED ORDER — LIDOCAINE HCL (PF) 1 % IJ SOLN
30.0000 mL | INTRAMUSCULAR | Status: AC | PRN
  Administered 2019-07-22: 30 mL via SUBCUTANEOUS
  Filled 2019-07-21: qty 30

## 2019-07-21 MED ORDER — TERBUTALINE SULFATE 1 MG/ML IJ SOLN: 0.2500 mg | Freq: Once | INTRAMUSCULAR | Status: DC | PRN

## 2019-07-21 MED ORDER — PHENYLEPHRINE 40 MCG/ML (10ML) SYRINGE FOR IV PUSH (FOR BLOOD PRESSURE SUPPORT): 80.0000 ug | PREFILLED_SYRINGE | INTRAVENOUS | Status: DC | PRN

## 2019-07-21 MED ORDER — OXYTOCIN 40 UNITS IN NORMAL SALINE INFUSION - SIMPLE MED: 2.5000 [IU]/h | INTRAVENOUS | Status: DC

## 2019-07-21 MED ORDER — OXYTOCIN 40 UNITS IN NORMAL SALINE INFUSION - SIMPLE MED
1.0000 m[IU]/min | INTRAVENOUS | Status: DC
  Administered 2019-07-21 (×2): 2 m[IU]/min via INTRAVENOUS
  Filled 2019-07-21: qty 1000

## 2019-07-21 MED ORDER — LACTATED RINGERS IV SOLN: 500.0000 mL | Freq: Once | INTRAVENOUS | Status: DC

## 2019-07-21 MED ORDER — ONDANSETRON HCL 4 MG/2ML IJ SOLN
4.0000 mg | Freq: Four times a day (QID) | INTRAMUSCULAR | Status: DC | PRN
  Administered 2019-07-21 – 2019-07-22 (×2): 4 mg via INTRAVENOUS
  Filled 2019-07-21 (×2): qty 2

## 2019-07-21 MED ORDER — MISOPROSTOL 50MCG HALF TABLET
ORAL_TABLET | ORAL | Status: AC
  Filled 2019-07-21: qty 1

## 2019-07-21 MED ORDER — SOD CITRATE-CITRIC ACID 500-334 MG/5ML PO SOLN
30.0000 mL | ORAL | Status: DC | PRN
  Administered 2019-07-22: 30 mL via ORAL
  Filled 2019-07-21: qty 30

## 2019-07-21 MED ORDER — EPHEDRINE 5 MG/ML INJ: 10.0000 mg | INTRAVENOUS | Status: DC | PRN

## 2019-07-21 MED ORDER — FENTANYL CITRATE (PF) 100 MCG/2ML IJ SOLN
100.0000 ug | INTRAMUSCULAR | Status: DC | PRN
  Filled 2019-07-21: qty 2

## 2019-07-21 MED ORDER — LIDOCAINE HCL (PF) 1 % IJ SOLN
INTRAMUSCULAR | Status: DC | PRN
  Administered 2019-07-21: 2 mL via EPIDURAL
  Administered 2019-07-21: 3 mL via EPIDURAL
  Administered 2019-07-21: 5 mL via EPIDURAL

## 2019-07-21 MED ORDER — SODIUM CHLORIDE (PF) 0.9 % IJ SOLN
INTRAMUSCULAR | Status: DC | PRN
  Administered 2019-07-21: 12 mL/h via EPIDURAL

## 2019-07-21 MED ORDER — ACETAMINOPHEN 325 MG PO TABS: 650.0000 mg | ORAL_TABLET | ORAL | Status: DC | PRN

## 2019-07-21 MED ORDER — MISOPROSTOL 50MCG HALF TABLET
50.0000 ug | ORAL_TABLET | ORAL | Status: DC | PRN
  Administered 2019-07-21: 16:00:00 50 ug via ORAL

## 2019-07-21 MED ORDER — LACTATED RINGERS IV SOLN: 500.0000 mL | INTRAVENOUS | Status: DC | PRN

## 2019-07-21 MED ORDER — FENTANYL-BUPIVACAINE-NACL 0.5-0.125-0.9 MG/250ML-% EP SOLN
EPIDURAL | Status: AC
  Filled 2019-07-21: qty 250

## 2019-07-21 MED ORDER — OXYTOCIN BOLUS FROM INFUSION
500.0000 mL | Freq: Once | INTRAVENOUS | Status: AC
  Administered 2019-07-22: 500 mL via INTRAVENOUS

## 2019-07-21 MED ORDER — LACTATED RINGERS IV SOLN
INTRAVENOUS | Status: DC
  Administered 2019-07-21 – 2019-07-22 (×2): via INTRAVENOUS

## 2019-07-21 NOTE — Progress Notes (Signed)
VE ft/60/-2 applied Tracing cat 1 Intracervical balloon placed Pitocin 2 miu P) d/c pitocin. Start cervical ripening with oral cytotec

## 2019-07-21 NOTE — Progress Notes (Signed)
S; feels better with foley out  O: BP (!) 144/80   Pulse 80   Temp 98.5 F (36.9 C) (Oral)   Resp 20   Ht 5' 2.5" (1.588 m)   Wt 73.8 kg   LMP 10/09/2018   BMI 29.30 kg/m  VE 4/50-60/-3 Bloody show  Tracing: baseline 130 (+) accels occ variables. Ctx q 2- 98mins  IMP: latent phase  term gestation P) start pitocin. Left exaggerated sims position. Defer amniotomy

## 2019-07-21 NOTE — Anesthesia Preprocedure Evaluation (Signed)
Anesthesia Evaluation  Patient identified by MRN, date of birth, ID band Patient awake    Reviewed: Allergy & Precautions, Patient's Chart, lab work & pertinent test results  Airway Mallampati: II  TM Distance: >3 FB     Dental   Pulmonary former smoker,    breath sounds clear to auscultation       Cardiovascular negative cardio ROS   Rhythm:Regular Rate:Normal     Neuro/Psych negative neurological ROS     GI/Hepatic Neg liver ROS, GERD  ,  Endo/Other  negative endocrine ROS  Renal/GU negative Renal ROS     Musculoskeletal   Abdominal   Peds  Hematology negative hematology ROS (+)   Anesthesia Other Findings   Reproductive/Obstetrics (+) Pregnancy                             Anesthesia Physical Anesthesia Plan  ASA: II  Anesthesia Plan: Epidural   Post-op Pain Management:    Induction:   PONV Risk Score and Plan: Treatment may vary due to age or medical condition  Airway Management Planned: Natural Airway  Additional Equipment:   Intra-op Plan:   Post-operative Plan:   Informed Consent: I have reviewed the patients History and Physical, chart, labs and discussed the procedure including the risks, benefits and alternatives for the proposed anesthesia with the patient or authorized representative who has indicated his/her understanding and acceptance.       Plan Discussed with:   Anesthesia Plan Comments:         Anesthesia Quick Evaluation

## 2019-07-21 NOTE — Anesthesia Procedure Notes (Signed)
Epidural Patient location during procedure: OB Start time: 07/21/2019 10:00 PM End time: 07/21/2019 10:05 PM  Staffing Anesthesiologist: Suzette Battiest, MD Performed: anesthesiologist   Preanesthetic Checklist Completed: patient identified, site marked, surgical consent, pre-op evaluation, timeout performed, IV checked, risks and benefits discussed and monitors and equipment checked  Epidural Patient position: sitting Prep: site prepped and draped and DuraPrep Patient monitoring: continuous pulse ox and blood pressure Approach: midline Location: L3-L4 Injection technique: LOR air  Needle:  Needle type: Tuohy  Needle gauge: 17 G Needle length: 9 cm and 9 Needle insertion depth: 6 cm Catheter type: closed end flexible Catheter size: 19 Gauge Catheter at skin depth: 11 cm Test dose: negative  Assessment Events: blood not aspirated, injection not painful, no injection resistance, negative IV test and no paresthesia

## 2019-07-21 NOTE — H&P (Signed)
Patricia Knight is a 33 y.o. female presenting for IOL at term with SGA baby. GBS cx neg. [place] labs pending in office.6lb 2 oz( 28%) . OB History    Gravida  1   Para  0   Term  0   Preterm  0   AB  0   Living  0     SAB  0   TAB  0   Ectopic  0   Multiple  0   Live Births  0          Past Medical History:  Diagnosis Date  . Depression    Zoloft  . GERD (gastroesophageal reflux disease)    Prilosec; Diclegis   Past Surgical History:  Procedure Laterality Date  . WISDOM TOOTH EXTRACTION     Family History: family history is not on file. Social History:  reports that she quit smoking about 8 years ago. She has never used smokeless tobacco. She reports current alcohol use. She reports that she does not use drugs.     Maternal Diabetes: No Genetic Screening: Declined Maternal Ultrasounds/Referrals: Normal Fetal Ultrasounds or other Referrals:  None Maternal Substance Abuse:  No Significant Maternal Medications:  Meds include: Zoloft Significant Maternal Lab Results:  Group B Strep negative Other Comments:  depression controlled  Review of Systems  All other systems reviewed and are negative.  History Dilation: Fingertip Effacement (%): 70 Station: -1 Exam by:: Curly [place], RN Blood pressure (!) 144/80, pulse 80, temperature 98.5 F (36.9 C), temperature source Oral, resp. rate 20, height 5' 2.5" (1.588 m), weight 73.8 kg. Exam Physical Exam  Constitutional: She is oriented to person, place, and time. She appears well-developed and well-nourished.  HENT:  Head: Atraumatic.  Eyes: EOM are normal.  Neck: Neck supple.  Cardiovascular: Normal rate and regular rhythm.  Respiratory: Effort normal.  GI: Soft.  Neurological: She is alert and oriented to person, place, and time.  Skin: Skin is warm and dry.  Psychiatric: She has a normal mood and affect.    Prenatal labs: ABO, Rh: --/--/A POS, A POS Performed at [facility], [place] [address] [place], [place] [address]  [phone]/27 1415) Antibody: NEG (11/27 1415) Rubella:  NonImmune RPR:   NR HBsAg:   neg HIV:   NR GBS:   neg  Assessment/Plan: Term gestation SGa Depression controlled with zoloft ? Gest HTN P) admit routine labs. Add PCR. [place] labs pending at office Intracervical balloon. Cytotec. Pitocin prn. Watch BP. Rubella vaccine pp   Meranda Dechaine A Cathaleen Korol 07/21/2019, 4:16 PM

## 2019-07-22 ENCOUNTER — Encounter (HOSPITAL_COMMUNITY): Payer: Self-pay

## 2019-07-22 LAB — RPR: RPR Ser Ql: NONREACTIVE

## 2019-07-22 LAB — ABO/RH: ABO/RH(D): A POS

## 2019-07-22 MED ORDER — ZOLPIDEM TARTRATE 5 MG PO TABS: 5.0000 mg | ORAL_TABLET | Freq: Every evening | ORAL | Status: DC | PRN

## 2019-07-22 MED ORDER — FENTANYL-BUPIVACAINE-NACL 0.5-0.125-0.9 MG/250ML-% EP SOLN
12.0000 mL/h | EPIDURAL | Status: DC | PRN
  Administered 2019-07-22: 11:00:00 12 mL/h via EPIDURAL
  Filled 2019-07-22: qty 250

## 2019-07-22 MED ORDER — IBUPROFEN 600 MG PO TABS
600.0000 mg | ORAL_TABLET | Freq: Four times a day (QID) | ORAL | Status: DC
  Administered 2019-07-22 – 2019-07-24 (×6): 600 mg via ORAL
  Filled 2019-07-22 (×7): qty 1

## 2019-07-22 MED ORDER — ALUM & MAG HYDROXIDE-SIMETH 200-200-20 MG/5ML PO SUSP
30.0000 mL | Freq: Once | ORAL | Status: AC
  Administered 2019-07-22: 10:00:00 30 mL via ORAL
  Filled 2019-07-22: qty 30

## 2019-07-22 MED ORDER — LACTATED RINGERS AMNIOINFUSION
INTRAVENOUS | Status: DC
  Administered 2019-07-22: 10:00:00 150 mL/h via INTRAUTERINE

## 2019-07-22 MED ORDER — DIBUCAINE (PERIANAL) 1 % EX OINT: 1.0000 "application " | TOPICAL_OINTMENT | CUTANEOUS | Status: DC | PRN

## 2019-07-22 MED ORDER — WITCH HAZEL-GLYCERIN EX PADS: 1.0000 "application " | MEDICATED_PAD | CUTANEOUS | Status: DC | PRN

## 2019-07-22 MED ORDER — PANTOPRAZOLE SODIUM 40 MG IV SOLR
40.0000 mg | Freq: Once | INTRAVENOUS | Status: AC
  Administered 2019-07-22: 10:00:00 40 mg via INTRAVENOUS
  Filled 2019-07-22: qty 40

## 2019-07-22 MED ORDER — ONDANSETRON HCL 4 MG PO TABS: 4.0000 mg | ORAL_TABLET | ORAL | Status: DC | PRN

## 2019-07-22 MED ORDER — DIPHENHYDRAMINE HCL 25 MG PO CAPS: 25.0000 mg | ORAL_CAPSULE | Freq: Four times a day (QID) | ORAL | Status: DC | PRN

## 2019-07-22 MED ORDER — OXYCODONE HCL 5 MG PO TABS: 5.0000 mg | ORAL_TABLET | ORAL | Status: DC | PRN

## 2019-07-22 MED ORDER — MEASLES, MUMPS & RUBELLA VAC IJ SOLR
0.5000 mL | Freq: Once | INTRAMUSCULAR | Status: AC
  Administered 2019-07-24: 09:00:00 0.5 mL via SUBCUTANEOUS
  Filled 2019-07-22: qty 0.5

## 2019-07-22 MED ORDER — BENZOCAINE-MENTHOL 20-0.5 % EX AERO
1.0000 "application " | INHALATION_SPRAY | CUTANEOUS | Status: DC | PRN
  Filled 2019-07-22: qty 56

## 2019-07-22 MED ORDER — ONDANSETRON HCL 4 MG/2ML IJ SOLN: 4.0000 mg | INTRAMUSCULAR | Status: DC | PRN

## 2019-07-22 MED ORDER — FERROUS SULFATE 325 (65 FE) MG PO TABS
325.0000 mg | ORAL_TABLET | Freq: Two times a day (BID) | ORAL | Status: DC
  Administered 2019-07-22 – 2019-07-24 (×3): 325 mg via ORAL
  Filled 2019-07-22 (×3): qty 1

## 2019-07-22 MED ORDER — BUPIVACAINE HCL (PF) 0.25 % IJ SOLN
INTRAMUSCULAR | Status: DC | PRN
  Administered 2019-07-22 (×2): 4 mL via EPIDURAL

## 2019-07-22 MED ORDER — SERTRALINE HCL 50 MG PO TABS
50.0000 mg | ORAL_TABLET | Freq: Every day | ORAL | Status: DC
  Administered 2019-07-22 – 2019-07-23 (×2): 50 mg via ORAL
  Filled 2019-07-22 (×2): qty 1

## 2019-07-22 MED ORDER — SENNOSIDES-DOCUSATE SODIUM 8.6-50 MG PO TABS
2.0000 | ORAL_TABLET | ORAL | Status: DC
  Administered 2019-07-24: 2 via ORAL
  Filled 2019-07-22 (×2): qty 2

## 2019-07-22 MED ORDER — ACETAMINOPHEN 325 MG PO TABS: 650.0000 mg | ORAL_TABLET | ORAL | Status: DC | PRN

## 2019-07-22 MED ORDER — COCONUT OIL OIL: 1.0000 "application " | TOPICAL_OIL | Status: DC | PRN

## 2019-07-22 MED ORDER — DICYCLOMINE HCL 10 MG/5ML PO SOLN
10.0000 mg | Freq: Once | ORAL | Status: AC
  Administered 2019-07-22: 10 mg via ORAL
  Filled 2019-07-22: qty 5

## 2019-07-22 MED ORDER — OXYCODONE HCL 5 MG PO TABS: 10.0000 mg | ORAL_TABLET | ORAL | Status: DC | PRN

## 2019-07-22 MED ORDER — PRENATAL MULTIVITAMIN CH
1.0000 | ORAL_TABLET | Freq: Every day | ORAL | Status: DC
  Administered 2019-07-23: 1 via ORAL
  Filled 2019-07-22: qty 1

## 2019-07-22 MED ORDER — SIMETHICONE 80 MG PO CHEW: 80.0000 mg | CHEWABLE_TABLET | ORAL | Status: DC | PRN

## 2019-07-22 NOTE — Lactation Note (Addendum)
This note was copied from a baby's chart. Lactation Consultation Note  Patient Name: Patricia Knight [id number] Date: 07/22/2019 Reason for consult: Initial assessment;1st time breastfeeding;Term P1, 7 hour female infant. Mom with hx: PIH and depression on Zoloft L2 safe with breastfeeding. Infant had 3 stools since birth. 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 Per mom, she feels breastfeeding is going well, infant breastfed for 45 minutes in recovery. Mom latched infant on right breast using the football hold position, mom tickled infant below nose and waited until infant mouth was wide, infant latched with top lip flanged out, nose and chin touching breast. Infant was suckling with swallows and sustained latch was still breastfeeding after 6 minutes when [place] left room.  Parents knows to do STS with infant. Mom understands to breastfeed infant according to hunger cues, 8 to 12 times within 24 hours and on demand. Mom understands to call Nurse or [place] if she has any questions, concerns or need assistance with latching infant to breast. Mom made aware of O/P services, breastfeeding support groups, community resources, and our phone # for post-discharge questions.   Maternal Data Formula Feeding for Exclusion: No Has patient been taught Hand Expression?: Yes Does the patient have breastfeeding experience prior to this delivery?: No  Feeding Feeding Type: Breast Fed  LATCH Score Latch: Grasps breast easily, tongue down, lips flanged, rhythmical sucking.  Audible Swallowing: Spontaneous and intermittent  Type of Nipple: Everted at rest and after stimulation  Comfort (Breast/Nipple): Soft / non-tender  Hold (Positioning): Assistance needed to correctly position infant at breast and maintain latch.  LATCH Score: 9  Interventions Interventions: Breast feeding basics reviewed;Breast compression;Adjust position;Assisted with  latch;Skin to skin;Support pillows;Breast massage;Position options;Hand express;Expressed milk  Lactation Tools Discussed/Used WIC Program: No   Consult Status Consult Status: Follow-up Date: 07/23/19 Follow-up type: In-patient    Vicente Serene 07/22/2019, 10:06 PM

## 2019-07-22 NOTE — Progress Notes (Signed)
S; belching and vomiting Pushing  O: BP 138/89   Pulse (!) 102   Temp 98.6 F (37 C) (Axillary)   Resp 16   Ht 5' 2.5" (1.588 m)   Wt 73.8 kg   LMP 10/09/2018   SpO2 100%   BMI 29.30 kg/m   pitocin Ve fully (+)1 station  mec fluid Amnioinfusion  Tracing: baseline 120 (+) accel  Some early decel Ctx q 2-3 mins  IMP; complete P) cont pushing

## 2019-07-22 NOTE — Progress Notes (Signed)
S: c/o lack of sleep Epidural just reduced due to back pain  O: BP 123/70   Pulse 92   Temp 98 F (36.7 C) (Oral)   Resp 18   Ht 5' 2.5" (1.588 m)   Wt 73.8 kg   LMP 10/09/2018   BMI 29.30 kg/m  VE 4-5/100/-1 with bulging bag noted  Pitocin Tracing. Baseline 120 (+) accels to 150  Ctx q 2-4 mins  IMP: latent phase Term gestation P) cont present mgmt. Increase pitocin. Defer amniotomy

## 2019-07-22 NOTE — Progress Notes (Signed)
S: c/o rawness of throat. Tried bicitra w/o response  O: BP 120/69 (BP Location: Right Arm)   Pulse 84   Temp 98.4 F (36.9 C) (Oral)   Resp 18   Ht 5' 2.5" (1.588 m)   Wt 73.8 kg   LMP 10/09/2018   SpO2 100%   BMI 29.30 kg/m  VE  Amniotic Bag at introitus( green fluid) AROM, thick mec VE complete/0 /+1  Tracing: baseline 120 (+) accel to 160 Ctx q 2-4 mins  IMP: complete P) amnioinfusion . Labor vtx down. Cont with pitocin. GI cocktail

## 2019-07-23 LAB — CBC
HCT: 31.7 % — ABNORMAL LOW (ref 36.0–46.0)
Hemoglobin: 10 g/dL — ABNORMAL LOW (ref 12.0–15.0)
MCH: 26.1 pg (ref 26.0–34.0)
MCHC: 31.5 g/dL (ref 30.0–36.0)
MCV: 82.8 fL (ref 80.0–100.0)
Platelets: 294 10*3/uL (ref 150–400)
RBC: 3.83 MIL/uL — ABNORMAL LOW (ref 3.87–5.11)
RDW: 13.8 % (ref 11.5–15.5)
WBC: 18.4 10*3/uL — ABNORMAL HIGH (ref 4.0–10.5)
nRBC: 0 % (ref 0.0–0.2)

## 2019-07-23 NOTE — Lactation Note (Signed)
This note was copied from a baby's chart. Lactation Consultation Note  Patient Name: Girl Seher Schlagel [id number] Date: 07/23/2019   Infant is 33 hrs old. Based on Mom's description of her breast changes & her veining, she will likely have an adequate/abundant supply once her milk comes to volume.   The tip of Mom's L nipple has some purple discoloration on nipple, which Mom says is typical for her. Note: Mom reports that sometimes her fingers turn purple when she gets cold. On Mom's R nipple, it appears that infant may have been pinching the nipple with latch. There is also a spot on Mom's R areola where infant latched in the wrong place. We talked about how to get an asymmetric latch.   Mom reports that infant fed for about 4 hrs last night. We talked about how to know if infant is sucking nutritively or not. Hand expression was done with Mom & about 2 mL was expressed with relative ease. The infant was finger-fed 1 mL before falling asleep.   Parents know they can call out for further assist, if desired.  Matthias Hughs [facility] 07/23/2019, 11:35 AM

## 2019-07-23 NOTE — Progress Notes (Signed)
PPD1 SVD:   S:  Pt reports feeling sore/ Tolerating po/ Voiding without problems/ No n/v/ Bleeding is moderate/ Pain controlled withprescription NSAID's including motrin  Newborn info live female    O:  A & O x 3 / VS: Blood pressure 121/83, pulse 82, temperature 98.7 F (37.1 C), temperature source Oral, resp. rate 18, height 5' 2.5" (1.588 m), weight 73.8 kg, last menstrual period 10/09/2018, SpO2 100 %, unknown if currently breastfeeding.  LABS:  Results for orders placed or performed during the hospital encounter of 07/21/19 (from the past 24 hour(s))  CBC     Status: Abnormal   Collection Time: 07/23/19  4:08 AM  Result Value Ref Range   WBC 18.4 (H) 4.0 - 10.5 K/uL   RBC 3.83 (L) 3.87 - 5.11 MIL/uL   Hemoglobin 10.0 (L) 12.0 - 15.0 g/dL   HCT 31.7 (L) 36.0 - 46.0 %   MCV 82.8 80.0 - 100.0 fL   MCH 26.1 26.0 - 34.0 pg   MCHC 31.5 30.0 - 36.0 g/dL   RDW 13.8 11.5 - 15.5 %   Platelets 294 150 - 400 K/uL   nRBC 0.0 0.0 - 0.2 %    I&O: I/O last 3 completed shifts: In: 335.6 [I.V.:335.6] Out: 1066 [Urine:950; Blood:116]   No intake/output data recorded.  Lungs: chest clear, no wheezing, rales, normal symmetric air entry  Heart: regular rate and rhythm, S1, S2 normal, no murmur, click, rub or gallop  Abdomen: uterus firm at umb  Perineum: healing with good reapproximation  Lochia: moderate  Extremities:no redness or tenderness in the calves or thighs, no edema    A/P: PPD # 1/ G1P1001 Depression on zoloft  Doing well  Continue routine post partum orders  D/c home in am if remains stable

## 2019-07-23 NOTE — Progress Notes (Signed)
CSW received consult for MOB due to history of depression. CSW will screen out consult as MOB is actively taking 50mg of Zoloft daily to address her mental health.  Please consult CSW if MOB desires or if she answers ten or higher or answers yes to question 10 on her EPDS.  Amado Andal, MSW, LCSW-A Transitions of Care  Clinical Social Worker  [facility]  Medical ICU [phone]   

## 2019-07-23 NOTE — Anesthesia Postprocedure Evaluation (Signed)
Anesthesia Post Note  Patient: Patricia Knight  Procedure(s) Performed: AN AD HOC LABOR EPIDURAL     Patient location during evaluation: Mother Baby Anesthesia Type: Epidural Level of consciousness: awake and alert Pain management: pain level controlled Vital Signs Assessment: post-procedure vital signs reviewed and stable Respiratory status: spontaneous breathing, nonlabored ventilation and respiratory function stable Cardiovascular status: stable Postop Assessment: no headache, no backache and epidural receding Anesthetic complications: no    Last Vitals:  Vitals:   07/23/19 0116 07/23/19 0509  BP: 124/73 121/83  Pulse: 75 82  Resp: 18 18  Temp: 36.9 C 37.1 C  SpO2: 99% 100%    Last Pain:  Vitals:   07/23/19 0509  TempSrc: Oral  PainSc: 0-No pain   Pain Goal: Patients Stated Pain Goal: 1 (07/21/19 1900)                 Rayvon Char

## 2019-07-23 NOTE — Progress Notes (Signed)
Patient given MMR VIS. 

## 2019-07-24 MED ORDER — IBUPROFEN 600 MG PO TABS: 600.0000 mg | ORAL_TABLET | Freq: Four times a day (QID) | ORAL | 0 refills | Status: DC

## 2019-07-24 MED FILL — IBUPROFEN 600 MG TABLET: 600 | 8 days supply | Qty: 30 | Fill #0

## 2019-07-24 NOTE — Lactation Note (Signed)
This note was copied from a baby's chart. Lactation Consultation Note:  Mother reported a 20 minute feeding on the left and wished to BF on the right. Baby expressing hunger cues. Positioned mom in chair and explained what to look for with proper latch and positioning.  Observed a good latch and nutritive sucks and swallows. Explained what to expect when mature milk comes in as well as what to look for, how to treat and when to call (sore nipples/engorgement/infection). Advised mom to express milk and apply to nipples after feedings to promote healing. Also discussed limited use of the pacifier as to not interfere with feeding.  Advised mom that baby should feed 8-12 times in a 24 hour period and discussed the availability of Outpatient help, if needed.   Patient Name: Patricia Knight Date: 07/24/2019 Reason for consult: Follow-up assessment   Maternal Data    Feeding Feeding Type: Breast Fed  LATCH Score                   Interventions    Lactation Tools Discussed/Used     Consult Status      Darla Lesches 07/24/2019, 10:40 AM

## 2019-07-24 NOTE — Progress Notes (Signed)
PPD 2 SVD with 2nd degree and sulcus repair  Subjective:   reports feeling good tolerating po intake without nausea or vomiting, normal BM this am vaginal bleeding reported as light pain controlled  up ad lib / ambulatory in room / voiding QS without urinary concerns  Newborn Breast   Objective:   VS: BP 129/81 (BP Location: Left Arm)   Pulse 77   Temp 98.6 F (37 C) (Oral)   Resp 18   Ht 5' 2.5" (1.588 m)   Wt 73.8 kg   LMP 10/09/2018   SpO2 100%   Breastfeeding Unknown   BMI 29.30 kg/m   LABS:  Recent Labs    07/21/19 1420 07/21/19 2141 07/23/19 0408  WBC 11.1*  --  18.4*  HGB 12.0  --  10.0*  PLT 328 379 294   Blood type: --/--/A POS, A POS (11/27 1415) Rubella: Nonimmune (05/12 0000)   TDap UTD 2020 / offer flu vaccine prior to DC today                  Physical Exam: alert and oriented X3 without any distress or pain abdomen soft, non-tender, non-distended  uterine fundus firm, non-tender, Ueven perineum repaired / no edema lochia light extremities: no edema, no calf pain or tenderness   Assessment / Plan: PPD # 2 SVD repair 2nd degree and vaginal sulcus doing well - stable status routine post partum orders DC home- WOB instructions reviewed  Patricia Knight CNM, MSN, [facility] 07/24/2019, 8:14 AM

## 2019-07-24 NOTE — Discharge Summary (Addendum)
Obstetric Discharge Summary Reason for Admission: induction of labor - SGA Prenatal Procedures: ultrasound Intrapartum Procedures: spontaneous vaginal delivery and epidural Postpartum Procedures: MMR booster Complications-Operative and Postpartum: 2nd degree perineal laceration Hemoglobin  Date Value Ref Range Status  07/23/2019 10.0 (L) 12.0 - 15.0 g/dL Final   HCT  Date Value Ref Range Status  07/23/2019 31.7 (L) 36.0 - 46.0 % Final    Physical Exam:  General: alert, cooperative and no distress Lochia: appropriate Uterine Fundus: firm Incision: healing well DVT Evaluation: No evidence of DVT seen on physical exam.  Discharge Diagnoses: Term Pregnancy-delivered  Discharge Information: Date: 07/24/2019 Activity: pelvic rest Diet: routine Medications: PNV and Ibuprofen Condition: stable Instructions: refer to practice specific booklet Discharge to: home Follow-up Information    Servando Salina, MD. Schedule an appointment as soon as possible for a visit in 6 week(s).   Specialty: Obstetrics and Gynecology Contact information: [address] [place] [address] [phone]           Newborn Data: Live born female  Birth Weight: 6 lb 10 oz (3005 g) APGAR: 61, 87  Newborn Delivery   Birth date/time: 07/22/2019 14:09:00 Delivery type: Vaginal, Spontaneous      Home with mother.  Artelia Laroche 07/24/2019, 8:27 AM

## 2019-08-01 MED FILL — PRENAISSANCE PLUS SOFTGEL: 28-1-250 | 30 days supply | Qty: 30 | Fill #5

## 2019-08-01 MED FILL — DOXYLAMINE-PYRIDOXINE 10-10: 10-10 | 30 days supply | Qty: 120 | Fill #3

## 2019-08-01 MED FILL — metroNIDAZOLE 0.75 % CREA: 0.75 | 30 days supply | Qty: 45 | Fill #1

## 2019-09-04 MED FILL — PRENAISSANCE PLUS SOFTGEL: 28-1-250 | 30 days supply | Qty: 30 | Fill #6

## 2019-09-04 MED FILL — NORLYDA 0.35 MG TABS: 0.35 | 28 days supply | Qty: 28 | Fill #0

## 2019-09-04 MED FILL — SERTRALINE HCL 50 MG TABLET: 50 | 90 days supply | Qty: 90 | Fill #0

## 2019-09-28 MED FILL — SERTRALINE HCL 50 MG TABLET: 50 | 90 days supply | Qty: 90 | Fill #0

## 2019-09-28 MED FILL — NORLYDA 0.35 MG TABS: 0.35 | 28 days supply | Qty: 28 | Fill #1

## 2019-10-10 MED FILL — PRENAISSANCE PLUS SOFTGEL: 28-1-250 | 30 days supply | Qty: 30 | Fill #7

## 2019-11-09 MED FILL — PRENAISSANCE PLUS SOFTGEL: 28-1-250 | 30 days supply | Qty: 30 | Fill #8

## 2019-11-09 MED FILL — NORLYDA 0.35 MG TABS: 0.35 | 28 days supply | Qty: 28 | Fill #2

## 2019-12-19 MED FILL — NORLYDA 0.35 MG TABS: 0.35 | 84 days supply | Qty: 84 | Fill #3

## 2019-12-19 MED FILL — SERTRALINE HCL 50 MG TABLET: 50 | 90 days supply | Qty: 90 | Fill #0

## 2019-12-19 MED FILL — PRENAISSANCE PLUS SOFTGEL: 28-1-250 | 90 days supply | Qty: 90 | Fill #9

## 2019-12-21 ENCOUNTER — Other Ambulatory Visit (HOSPITAL_COMMUNITY): Payer: Self-pay | Admitting: Family Medicine

## 2019-12-21 MED FILL — metroNIDAZOLE 0.75 % CREA: 0.75 | 30 days supply | Qty: 45 | Fill #0

## 2020-05-10 MED FILL — PRENAISSANCE PLUS SOFTGEL: 28-1-250 | 30 days supply | Qty: 30 | Fill #0

## 2020-06-07 MED FILL — SERTRALINE HCL 50 MG TABLET: 50 | 90 days supply | Qty: 90 | Fill #0

## 2020-07-09 ENCOUNTER — Other Ambulatory Visit (HOSPITAL_COMMUNITY): Payer: Self-pay | Admitting: Family Medicine

## 2020-07-09 MED FILL — PRENAISSANCE PLUS SOFTGEL: 28-1-250 | 30 days supply | Qty: 30 | Fill #0

## 2020-10-24 MED FILL — SERTRALINE HCL 50 MG TABLET: 50 | 90 days supply | Qty: 90 | Fill #1

## 2021-03-06 ENCOUNTER — Other Ambulatory Visit (HOSPITAL_COMMUNITY): Payer: Self-pay

## 2021-03-06 MED ORDER — SERTRALINE HCL 50 MG PO TABS
50.0000 mg | ORAL_TABLET | Freq: Every day | ORAL | 0 refills | Status: DC
  Filled 2021-03-06: qty 90, 90d supply, fill #0

## 2021-03-07 ENCOUNTER — Other Ambulatory Visit (HOSPITAL_COMMUNITY): Payer: Self-pay

## 2021-04-17 ENCOUNTER — Other Ambulatory Visit (HOSPITAL_COMMUNITY): Payer: Self-pay

## 2021-04-17 MED ORDER — SERTRALINE HCL 50 MG PO TABS
50.0000 mg | ORAL_TABLET | Freq: Every day | ORAL | 1 refills | Status: DC
  Filled 2021-04-17 – 2021-07-31 (×2): qty 90, 90d supply, fill #0
  Filled 2021-12-09: qty 90, 90d supply, fill #1

## 2021-04-17 MED ORDER — METRONIDAZOLE 0.75 % EX CREA
TOPICAL_CREAM | CUTANEOUS | 5 refills | Status: DC
  Filled 2021-04-17: qty 45, 30d supply, fill #0

## 2021-04-25 ENCOUNTER — Other Ambulatory Visit (HOSPITAL_COMMUNITY): Payer: Self-pay

## 2021-07-31 ENCOUNTER — Other Ambulatory Visit (HOSPITAL_COMMUNITY): Payer: Self-pay

## 2021-09-24 ENCOUNTER — Other Ambulatory Visit (HOSPITAL_COMMUNITY): Payer: Self-pay

## 2021-09-24 MED ORDER — POLYMYXIN B-TRIMETHOPRIM 10000-0.1 UNIT/ML-% OP SOLN
1.0000 [drp] | Freq: Four times a day (QID) | OPHTHALMIC | 0 refills | Status: DC
  Filled 2021-09-24: qty 10, 25d supply, fill #0

## 2021-12-09 ENCOUNTER — Other Ambulatory Visit (HOSPITAL_COMMUNITY): Payer: Self-pay

## 2021-12-29 ENCOUNTER — Other Ambulatory Visit (HOSPITAL_COMMUNITY): Payer: Self-pay

## 2021-12-29 MED ORDER — LORAZEPAM 0.5 MG PO TABS
0.5000 mg | ORAL_TABLET | Freq: Every day | ORAL | 0 refills | Status: DC | PRN
  Filled 2021-12-29 – 2022-01-08 (×2): qty 15, 15d supply, fill #0

## 2022-01-02 ENCOUNTER — Other Ambulatory Visit: Payer: Self-pay | Admitting: Family Medicine

## 2022-01-02 DIAGNOSIS — N6310 Unspecified lump in the right breast, unspecified quadrant: Secondary | ICD-10-CM

## 2022-01-06 ENCOUNTER — Other Ambulatory Visit (HOSPITAL_COMMUNITY): Payer: Self-pay

## 2022-01-08 ENCOUNTER — Other Ambulatory Visit (HOSPITAL_COMMUNITY): Payer: Self-pay

## 2022-01-08 ENCOUNTER — Ambulatory Visit
Admission: RE | Admit: 2022-01-08 | Discharge: 2022-01-08 | Disposition: A | Discharge status: Home / Self Care | Payer: No Typology Code available for payment source | Source: Ambulatory Visit | Attending: Family Medicine | Admitting: Family Medicine

## 2022-01-08 DIAGNOSIS — N6310 Unspecified lump in the right breast, unspecified quadrant: Secondary | ICD-10-CM

## 2022-02-17 ENCOUNTER — Other Ambulatory Visit (HOSPITAL_COMMUNITY): Payer: Self-pay

## 2022-02-23 ENCOUNTER — Other Ambulatory Visit (HOSPITAL_COMMUNITY): Payer: Self-pay

## 2022-02-25 ENCOUNTER — Other Ambulatory Visit (HOSPITAL_COMMUNITY): Payer: Self-pay

## 2022-02-25 MED ORDER — OXYCODONE-ACETAMINOPHEN 5-325 MG PO TABS
1.0000 | ORAL_TABLET | ORAL | 0 refills | Status: DC
  Filled 2022-02-25: qty 20, 4d supply, fill #0

## 2022-02-25 MED ORDER — IBUPROFEN 800 MG PO TABS
800.0000 mg | ORAL_TABLET | Freq: Three times a day (TID) | ORAL | 2 refills | Status: DC | PRN
  Filled 2022-02-25: qty 30, 10d supply, fill #0

## 2022-02-25 MED ORDER — MISOPROSTOL 200 MCG PO TABS
800.0000 ug | ORAL_TABLET | ORAL | 0 refills | Status: DC
  Filled 2022-02-25: qty 4, 1d supply, fill #0

## 2022-02-25 MED ORDER — ONDANSETRON 4 MG PO TBDP
4.0000 mg | ORAL_TABLET | Freq: Three times a day (TID) | ORAL | 1 refills | Status: DC | PRN
  Filled 2022-02-25: qty 30, 10d supply, fill #0

## 2022-02-25 MED ORDER — TARON-C DHA 35-1 MG PO CAPS
1.0000 | ORAL_CAPSULE | Freq: Every day | ORAL | 3 refills | Status: DC
  Filled 2022-02-25: qty 90, 90d supply, fill #0
  Filled 2022-03-16: qty 90, 30d supply, fill #0

## 2022-02-26 ENCOUNTER — Other Ambulatory Visit (HOSPITAL_COMMUNITY): Payer: Self-pay

## 2022-03-16 ENCOUNTER — Other Ambulatory Visit (HOSPITAL_COMMUNITY): Payer: Self-pay

## 2022-05-07 ENCOUNTER — Other Ambulatory Visit (HOSPITAL_COMMUNITY): Payer: Self-pay

## 2022-05-07 MED ORDER — AMOXICILLIN-POT CLAVULANATE 875-125 MG PO TABS
1.0000 | ORAL_TABLET | Freq: Two times a day (BID) | ORAL | 0 refills | Status: DC
  Filled 2022-05-07: qty 20, 10d supply, fill #0

## 2022-05-07 MED ORDER — IPRATROPIUM BROMIDE 0.06 % NA SOLN
2.0000 | Freq: Three times a day (TID) | NASAL | 0 refills | Status: DC
  Filled 2022-05-07: qty 15, 25d supply, fill #0

## 2022-05-22 ENCOUNTER — Other Ambulatory Visit (HOSPITAL_COMMUNITY): Payer: Self-pay

## 2022-05-22 MED ORDER — SERTRALINE HCL 50 MG PO TABS
25.0000 mg | ORAL_TABLET | Freq: Every day | ORAL | 1 refills | Status: DC
  Filled 2022-05-22: qty 45, 90d supply, fill #0
  Filled 2022-09-04: qty 15, 30d supply, fill #1

## 2022-05-22 MED ORDER — PROMETHAZINE-DM 6.25-15 MG/5ML PO SYRP
5.0000 mL | ORAL_SOLUTION | Freq: Four times a day (QID) | ORAL | 0 refills | Status: DC | PRN
  Filled 2022-05-22: qty 200, 10d supply, fill #0

## 2022-05-22 MED ORDER — METRONIDAZOLE 0.75 % EX CREA
TOPICAL_CREAM | Freq: Two times a day (BID) | CUTANEOUS | 5 refills | Status: DC
  Filled 2022-05-22: qty 45, 30d supply, fill #0

## 2022-06-15 IMAGING — MG DIGITAL DIAGNOSTIC BILAT W/ TOMO W/ CAD
6 of 10 series · 6 of 30 positions shown · non-contrast
Comparison: None available.

CLINICAL DATA: 36-year-old female with itching and palpable
thickening along the upper outer quadrant of the right breast.

EXAM:
DIGITAL DIAGNOSTIC BILATERAL MAMMOGRAM WITH TOMOSYNTHESIS AND CAD;
ULTRASOUND RIGHT BREAST LIMITED
TECHNIQUE: Bilateral digital diagnostic mammography and breast tomosynthesis
was performed. The images were evaluated with computer-aided
detection.; Targeted ultrasound examination of the right breast was
performed

[L MLO synth-2D]
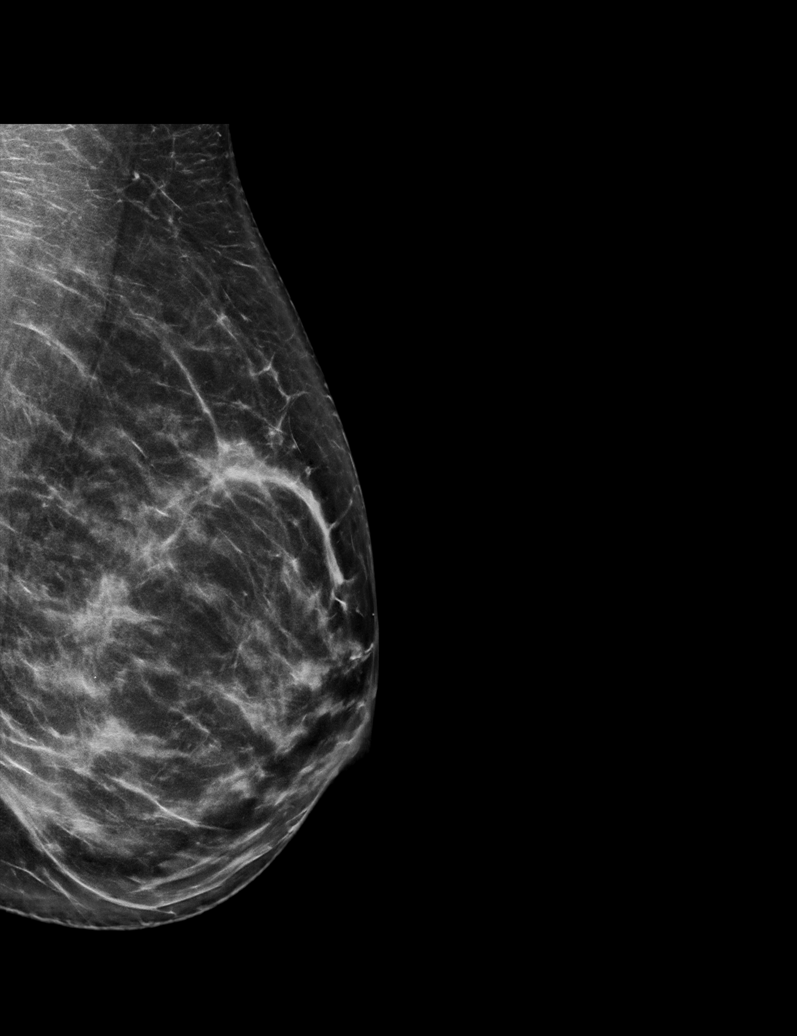

[L CC synth-2D]
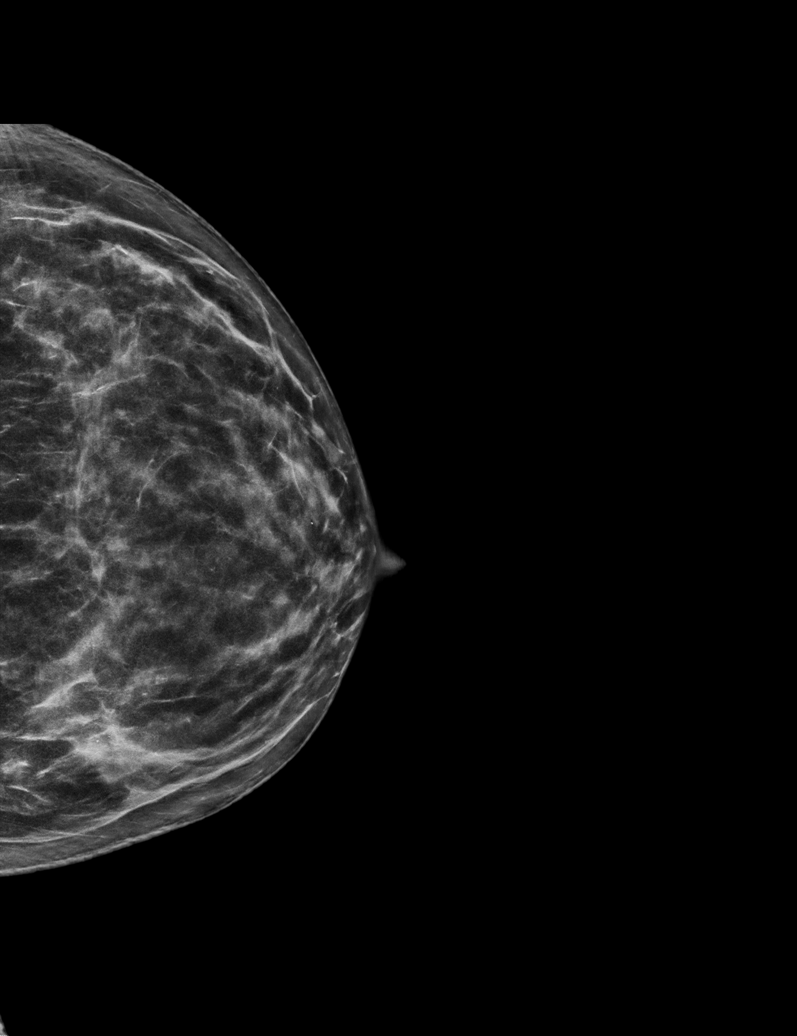

[R TAN synth-2D]
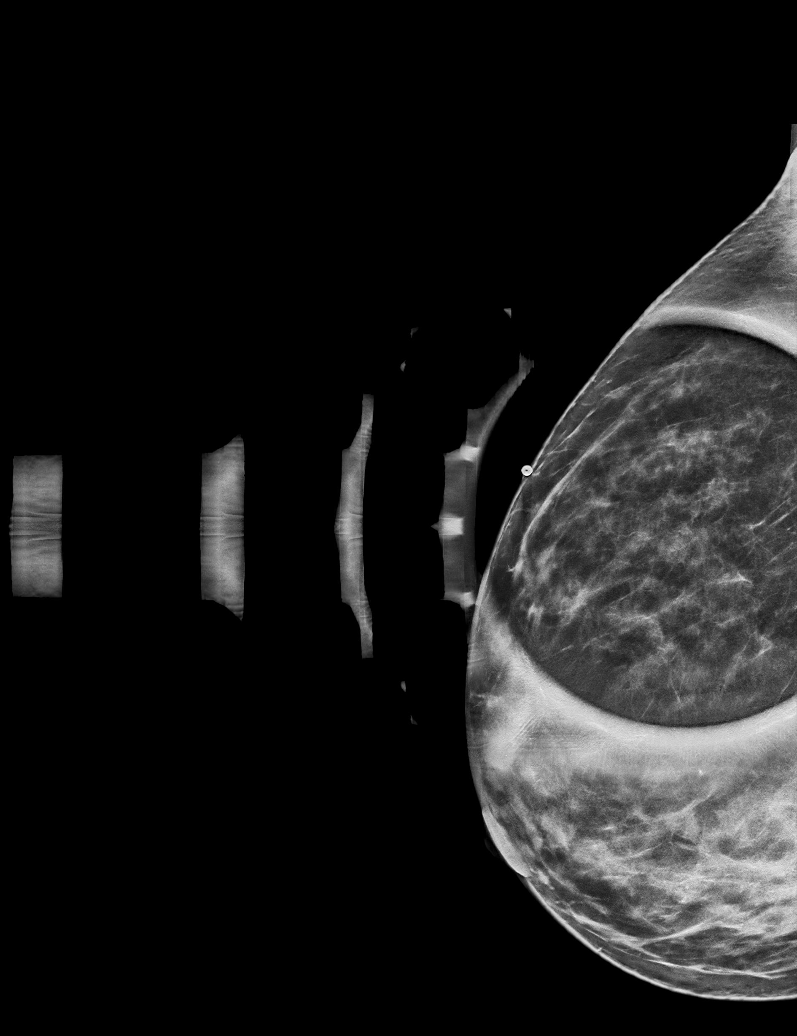

[R MLO synth-2D]
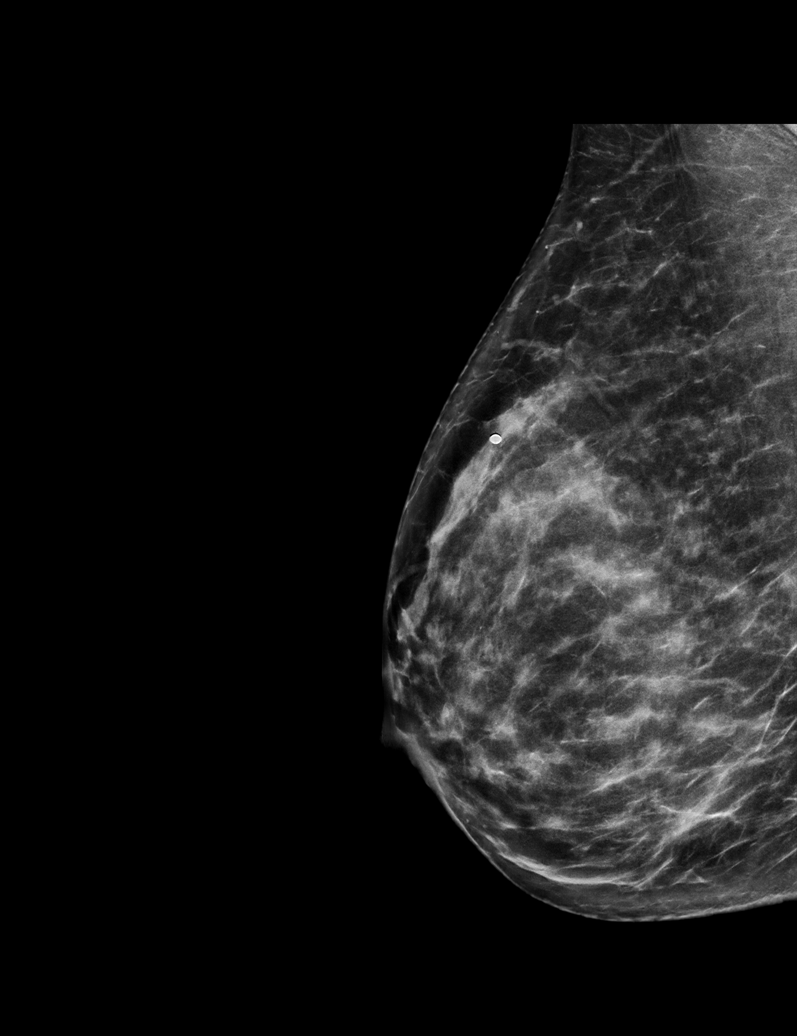

[R CC synth-2D]
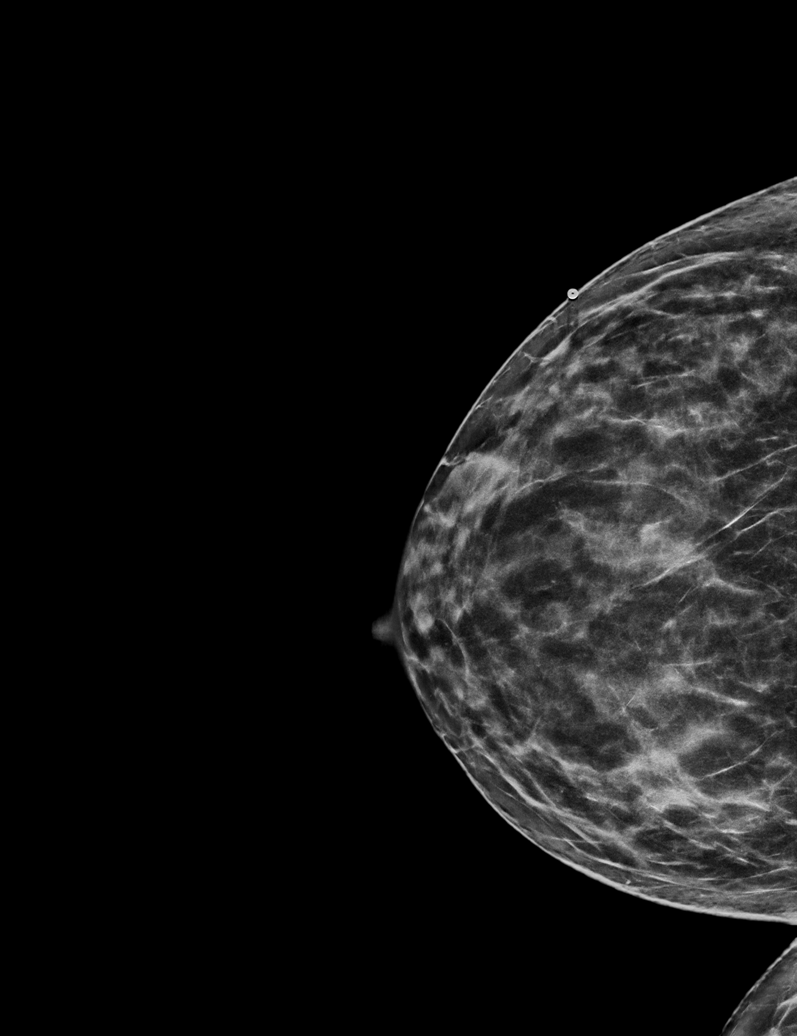

[R CC tomo · tomo slice 31/60.0]
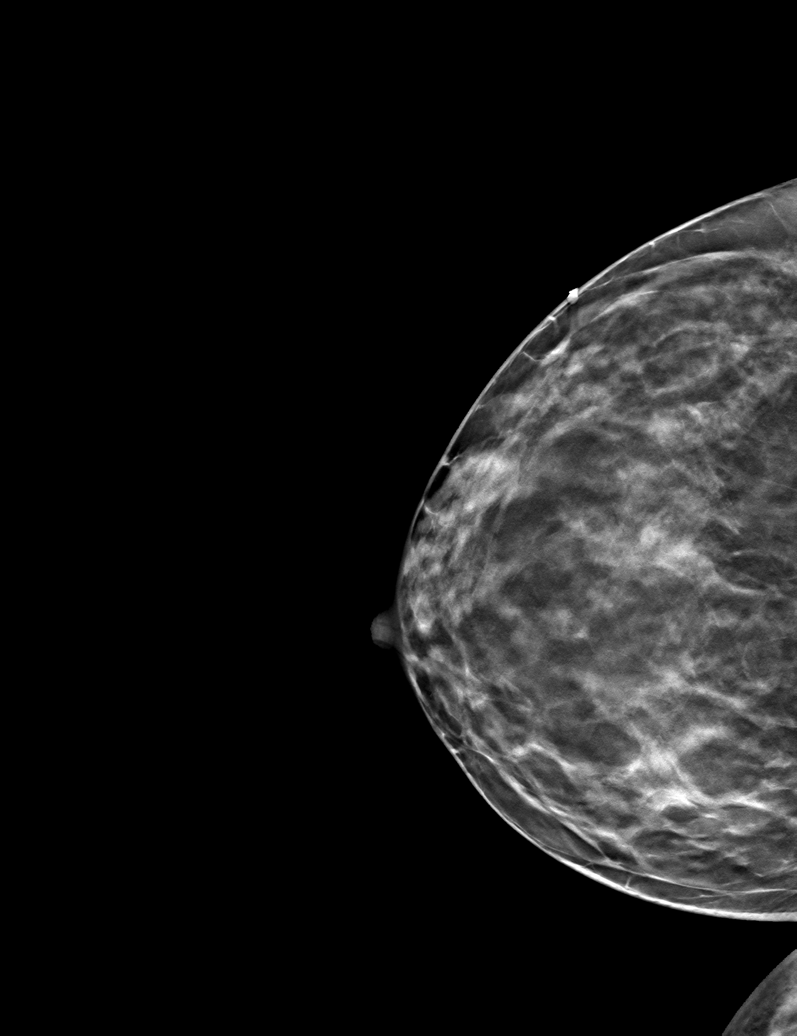

[6 of 30 positions shown; findings below may reference images not displayed]

ACR Breast Density Category c: The breast tissue is heterogeneously
dense, which may obscure small masses.
FINDINGS: A radiopaque BB was placed at the site of the patient's focal
symptoms in the upper outer right breast at mid depth. No suspicious
mammographic findings seen deep to the radiopaque BB. No suspicious
findings in the remainder of either breast.

Targeted ultrasound is performed, showing no focal or suspicious
sonographic findings in the upper-outer quadrant of the right
breast.
IMPRESSION: 1. No mammographic evidence of malignancy in either breast.
2. Unremarkable ultrasound evaluation of the upper outer right
breast.

RECOMMENDATION:
1. Clinical follow-up recommended for the symptomatic area of
concern in the right breast. Any further workup should be based on
clinical grounds.
2. Screening mammogram at age 40 unless there are persistent or
intervening clinical concerns. (Code:CO-6-B55)

I have discussed the findings and recommendations with the patient.
If applicable, a reminder letter will be sent to the patient
regarding the next appointment.

BI-RADS CATEGORY  1: Negative.

## 2022-06-15 IMAGING — US US BREAST*R* LIMITED INC AXILLA
1 series · 4 of 4 positions shown · non-contrast
Comparison: None available.

CLINICAL DATA: 36-year-old female with itching and palpable
thickening along the upper outer quadrant of the right breast.

EXAM:
DIGITAL DIAGNOSTIC BILATERAL MAMMOGRAM WITH TOMOSYNTHESIS AND CAD;
ULTRASOUND RIGHT BREAST LIMITED
TECHNIQUE: Bilateral digital diagnostic mammography and breast tomosynthesis
was performed. The images were evaluated with computer-aided
detection.; Targeted ultrasound examination of the right breast was
performed

[Series 1: us breast*right* limited inc axilla · 0.06mm/px · 4 of 4 slices shown]
[im 1/4]
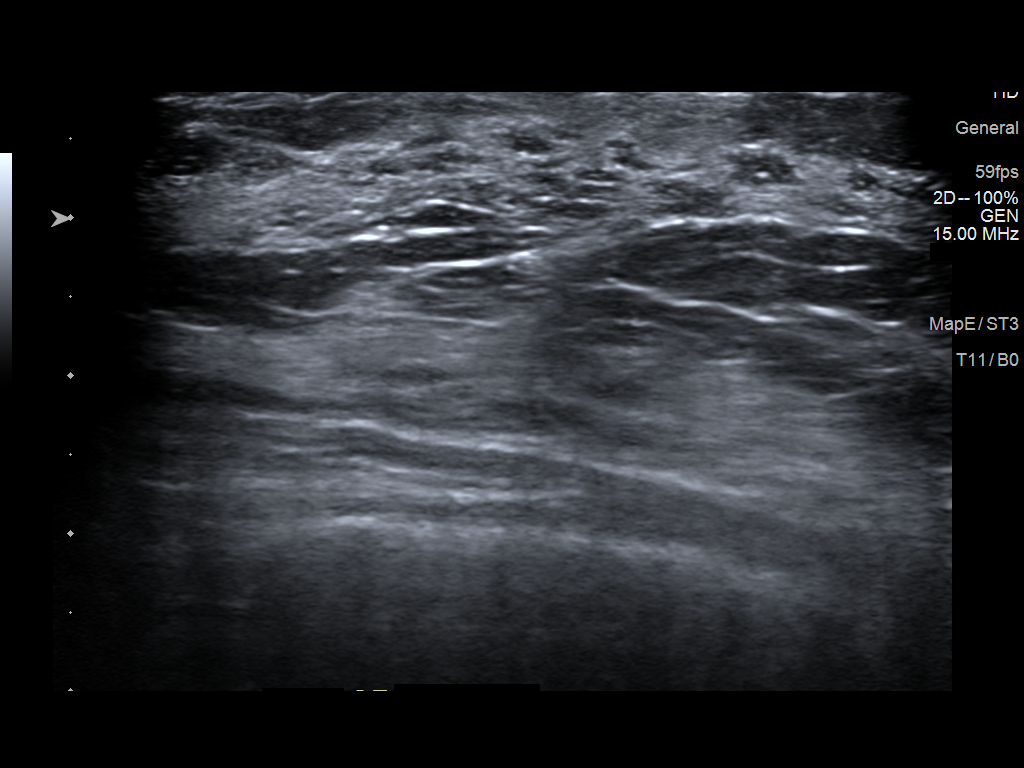
[im 2/4]
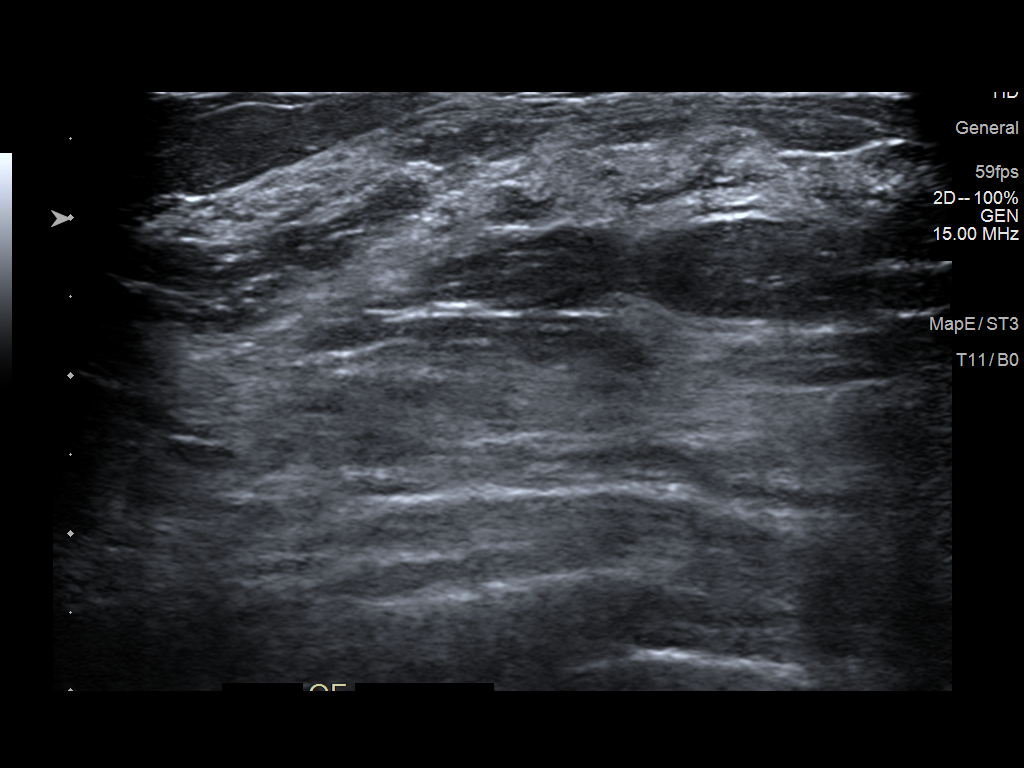
[im 3/4]
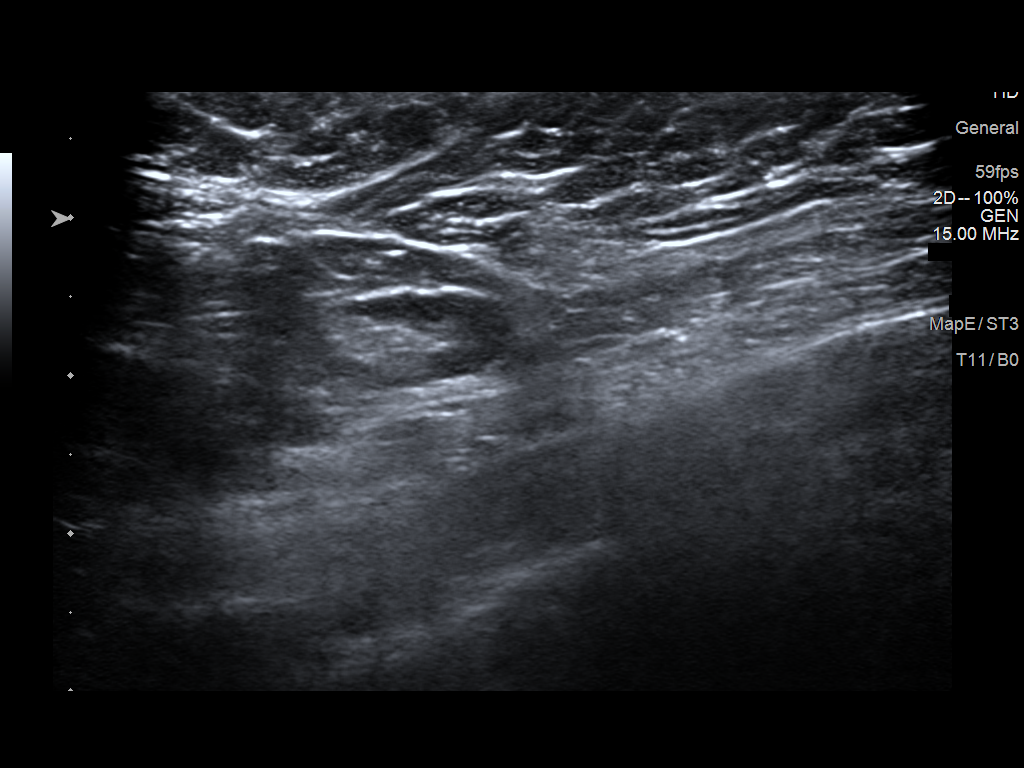
[im 4/4]
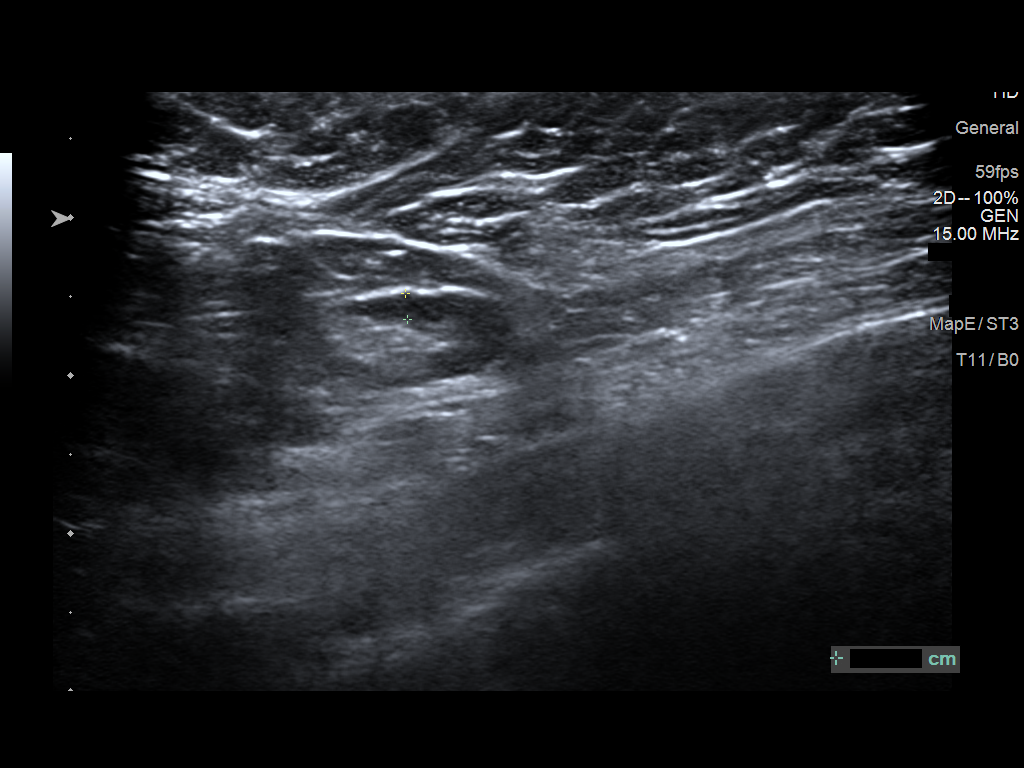

[4 of 4 positions shown; findings below may reference images not displayed]

ACR Breast Density Category c: The breast tissue is heterogeneously
dense, which may obscure small masses.
FINDINGS: A radiopaque BB was placed at the site of the patient's focal
symptoms in the upper outer right breast at mid depth. No suspicious
mammographic findings seen deep to the radiopaque BB. No suspicious
findings in the remainder of either breast.

Targeted ultrasound is performed, showing no focal or suspicious
sonographic findings in the upper-outer quadrant of the right
breast.
IMPRESSION: 1. No mammographic evidence of malignancy in either breast.
2. Unremarkable ultrasound evaluation of the upper outer right
breast.

RECOMMENDATION:
1. Clinical follow-up recommended for the symptomatic area of
concern in the right breast. Any further workup should be based on
clinical grounds.
2. Screening mammogram at age 40 unless there are persistent or
intervening clinical concerns. (Code:CO-6-B55)

I have discussed the findings and recommendations with the patient.
If applicable, a reminder letter will be sent to the patient
regarding the next appointment.

BI-RADS CATEGORY  1: Negative.

## 2022-09-04 ENCOUNTER — Other Ambulatory Visit (HOSPITAL_COMMUNITY): Payer: Self-pay

## 2022-11-10 ENCOUNTER — Other Ambulatory Visit (HOSPITAL_COMMUNITY): Payer: Self-pay

## 2022-11-10 MED ORDER — SERTRALINE HCL 50 MG PO TABS: 75.0000 mg | ORAL_TABLET | Freq: Every day | ORAL | 1 refills | Status: DC

## 2022-11-10 MED ORDER — SERTRALINE HCL 50 MG PO TABS
75.0000 mg | ORAL_TABLET | Freq: Every day | ORAL | 1 refills | Status: AC
  Filled 2022-11-10: qty 45, 30d supply, fill #0

## 2023-04-29 ENCOUNTER — Other Ambulatory Visit (HOSPITAL_COMMUNITY): Payer: Self-pay

## 2023-04-29 MED ORDER — ONDANSETRON 4 MG PO TBDP
4.0000 mg | ORAL_TABLET | Freq: Three times a day (TID) | ORAL | 4 refills | Status: DC | PRN
  Filled 2023-04-29: qty 30, 10d supply, fill #0
  Filled 2023-04-29: qty 1, 1d supply, fill #0
  Filled 2023-04-29: qty 29, 9d supply, fill #0
  Filled 2023-04-29: qty 1, 1d supply, fill #0
  Filled 2023-04-29: qty 29, 9d supply, fill #0

## 2023-05-04 ENCOUNTER — Other Ambulatory Visit (HOSPITAL_COMMUNITY): Payer: Self-pay

## 2023-05-05 ENCOUNTER — Other Ambulatory Visit (HOSPITAL_COMMUNITY): Payer: Self-pay

## 2023-05-05 MED ORDER — DOXYLAMINE-PYRIDOXINE 10-10 MG PO TBEC
DELAYED_RELEASE_TABLET | ORAL | 0 refills | Status: DC
  Filled 2023-05-05: qty 60, 30d supply, fill #0

## 2023-05-07 ENCOUNTER — Other Ambulatory Visit (HOSPITAL_COMMUNITY): Payer: Self-pay

## 2023-05-27 ENCOUNTER — Encounter (HOSPITAL_COMMUNITY): Payer: Self-pay

## 2023-05-27 ENCOUNTER — Inpatient Hospital Stay (HOSPITAL_COMMUNITY): Payer: PRIVATE HEALTH INSURANCE

## 2023-05-27 ENCOUNTER — Inpatient Hospital Stay (HOSPITAL_COMMUNITY)
Admission: AD | Admit: 2023-05-27 | Discharge: 2023-05-27 | Disposition: A | Discharge status: Home / Self Care | Payer: PRIVATE HEALTH INSURANCE | Attending: Obstetrics and Gynecology | Admitting: Obstetrics and Gynecology

## 2023-05-27 DIAGNOSIS — O021 Missed abortion: Secondary | ICD-10-CM | POA: Insufficient documentation

## 2023-05-27 NOTE — MAU Note (Signed)
Patient does not desire to wait for her paper would and would like to leave.

## 2023-05-27 NOTE — MAU Note (Signed)
.  Patricia Knight is a 38 y.o. at [redacted]w[redacted]d here in MAU reporting: Sent over from office per Dr. Cherly Hensen, MD to confirmed missed AB. Patient needs formal ultrasound. Denies VB. Reports very light lower abdominal cramping since pap smear performed.  Pain score: 0.5/10 lower abdomen Vitals:   05/27/23 1143  BP: 123/72  Pulse: 73  Resp: 16  Temp: 98.5 F (36.9 C)  SpO2: 100%

## 2023-05-27 NOTE — MAU Provider Note (Signed)
Event Date/Time   First Provider Initiated Contact with Patient 05/27/23 1152      S Ms. Patricia Knight is a 37 y.o. G3P1011 patient who presents to MAU per Dr. Cherly Hensen for a formal TVUS. .   O BP 123/72 (BP Location: Right Arm)   Pulse 73   Temp 98.5 F (36.9 C) (Oral)   Resp 16   Ht 5' 2.5" (1.588 m)   Wt 128 lb 9.6 oz (58.3 kg)   SpO2 100%   BMI 23.15 kg/m  Physical Exam Vitals and nursing note reviewed.  Constitutional:      General: She is not in acute distress.    Appearance: Normal appearance.  HENT:     Head: Normocephalic.  Pulmonary:     Effort: Pulmonary effort is normal.  Musculoskeletal:     Cervical back: Normal range of motion.  Skin:    General: Skin is warm and dry.  Neurological:     Mental Status: She is alert and oriented to person, place, and time.  Psychiatric:        Mood and Affect: Mood normal.     A Medical screening exam complete - Missed AB.  - Patient given results personally from Dr. Cherly Hensen via telephone.   P Discharge from MAU in stable condition Warning signs for worsening condition that would warrant emergency follow-up discussed Patient may return to MAU as needed   Carlynn Herald, [place] 05/27/2023 4:13 PM

## 2023-05-28 ENCOUNTER — Other Ambulatory Visit: Payer: Self-pay | Admitting: Obstetrics and Gynecology

## 2023-05-28 ENCOUNTER — Other Ambulatory Visit (HOSPITAL_COMMUNITY): Payer: Self-pay | Admitting: Obstetrics and Gynecology

## 2023-05-28 ENCOUNTER — Encounter (HOSPITAL_BASED_OUTPATIENT_CLINIC_OR_DEPARTMENT_OTHER): Payer: Self-pay | Admitting: Obstetrics and Gynecology

## 2023-05-28 ENCOUNTER — Other Ambulatory Visit: Payer: Self-pay

## 2023-05-28 DIAGNOSIS — R58 Hemorrhage, not elsewhere classified: Secondary | ICD-10-CM

## 2023-05-28 DIAGNOSIS — O021 Missed abortion: Secondary | ICD-10-CM

## 2023-05-28 MED ORDER — DOXYCYCLINE HYCLATE 100 MG IV SOLR
100.0000 mg | Freq: Once | INTRAVENOUS | Status: AC
  Administered 2023-06-01: 100 mg via INTRAVENOUS

## 2023-05-28 NOTE — Progress Notes (Signed)
Spoke w/ via phone for pre-op interview---pt Lab needs dos----  none       Lab results------ COVID test -----patient states asymptomatic no test needed Arrive at -------530 06-01-2023 NPO after MN NO Solid Food.  Clear liquids from MN until---430 Med rec completed Medications to take morning of surgery -----pepcid Diabetic medication -----n/a Patient instructed no nail polish to be worn day of surgery Patient instructed to bring photo id and insurance card day of surgery Patient aware to have Driver (ride ) / caregiver    for 24 hours after surgery  husband tyler-  Patient Special Instructions -----none Pre-Op special Instructions -----none Patient verbalized understanding of instructions that were given at this phone interview. Patient denies chest pain, sob, fever, cough at the interview.

## 2023-05-31 ENCOUNTER — Other Ambulatory Visit (HOSPITAL_COMMUNITY): Payer: Self-pay

## 2023-05-31 MED ORDER — MISOPROSTOL 200 MCG PO TABS
200.0000 ug | ORAL_TABLET | ORAL | 0 refills | Status: DC
  Filled 2023-05-31: qty 1, 1d supply, fill #0
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
## 2023-06-01 ENCOUNTER — Encounter (HOSPITAL_BASED_OUTPATIENT_CLINIC_OR_DEPARTMENT_OTHER)
Admission: RE | Disposition: A | Discharge status: Home / Self Care | Payer: Self-pay | Source: Home / Self Care | Attending: Obstetrics and Gynecology

## 2023-06-01 ENCOUNTER — Encounter (HOSPITAL_BASED_OUTPATIENT_CLINIC_OR_DEPARTMENT_OTHER): Payer: Self-pay | Admitting: Obstetrics and Gynecology

## 2023-06-01 ENCOUNTER — Ambulatory Visit (HOSPITAL_BASED_OUTPATIENT_CLINIC_OR_DEPARTMENT_OTHER): Payer: PRIVATE HEALTH INSURANCE | Admitting: Anesthesiology

## 2023-06-01 ENCOUNTER — Other Ambulatory Visit: Payer: Self-pay

## 2023-06-01 ENCOUNTER — Ambulatory Visit (HOSPITAL_BASED_OUTPATIENT_CLINIC_OR_DEPARTMENT_OTHER)
Admission: RE | Admit: 2023-06-01 | Discharge: 2023-06-01 | Disposition: A | Discharge status: Home / Self Care | Payer: PRIVATE HEALTH INSURANCE | Attending: Obstetrics and Gynecology | Admitting: Obstetrics and Gynecology

## 2023-06-01 ENCOUNTER — Ambulatory Visit (HOSPITAL_COMMUNITY)
Admission: RE | Admit: 2023-06-01 | Discharge: 2023-06-01 | Disposition: A | Discharge status: Home / Self Care | Payer: PRIVATE HEALTH INSURANCE | Source: Ambulatory Visit | Attending: Obstetrics and Gynecology | Admitting: Obstetrics and Gynecology

## 2023-06-01 DIAGNOSIS — O021 Missed abortion: Secondary | ICD-10-CM | POA: Insufficient documentation

## 2023-06-01 DIAGNOSIS — Z3A11 11 weeks gestation of pregnancy: Secondary | ICD-10-CM

## 2023-06-01 DIAGNOSIS — N96 Recurrent pregnancy loss: Secondary | ICD-10-CM | POA: Insufficient documentation

## 2023-06-01 DIAGNOSIS — Z87891 Personal history of nicotine dependence: Secondary | ICD-10-CM | POA: Diagnosis not present

## 2023-06-01 DIAGNOSIS — Z01818 Encounter for other preprocedural examination: Secondary | ICD-10-CM

## 2023-06-01 HISTORY — PX: OPERATIVE ULTRASOUND: SHX5996

## 2023-06-01 HISTORY — DX: Anxiety disorder, unspecified: F41.9

## 2023-06-01 HISTORY — DX: Family history of other specified conditions: Z84.89

## 2023-06-01 HISTORY — PX: DILATION AND EVACUATION: SHX1459

## 2023-06-01 HISTORY — DX: Presence of spectacles and contact lenses: Z97.3

## 2023-06-01 HISTORY — DX: Other complications of anesthesia, initial encounter: T88.59XA

## 2023-06-01 SURGERY — DILATION AND EVACUATION, UTERUS
Anesthesia: General | Site: Uterus

## 2023-06-01 MED ORDER — ONDANSETRON HCL 4 MG/2ML IJ SOLN
INTRAMUSCULAR | Status: AC
  Filled 2023-06-01: qty 2

## 2023-06-01 MED ORDER — ACETAMINOPHEN 325 MG PO TABS: 325.0000 mg | ORAL_TABLET | ORAL | Status: DC | PRN

## 2023-06-01 MED ORDER — SCOPOLAMINE 1 MG/3DAYS TD PT72
MEDICATED_PATCH | TRANSDERMAL | Status: AC
  Filled 2023-06-01: qty 1

## 2023-06-01 MED ORDER — LACTATED RINGERS IV SOLN: INTRAVENOUS | Status: DC

## 2023-06-01 MED ORDER — OXYCODONE HCL 5 MG/5ML PO SOLN: 5.0000 mg | Freq: Once | ORAL | Status: DC | PRN

## 2023-06-01 MED ORDER — 0.9 % SODIUM CHLORIDE (POUR BTL) OPTIME
TOPICAL | Status: DC | PRN
Start: 2023-06-01 — End: 2023-06-01
  Administered 2023-06-01: 500 mL

## 2023-06-01 MED ORDER — ACETAMINOPHEN 10 MG/ML IV SOLN: 1000.0000 mg | Freq: Once | INTRAVENOUS | Status: DC | PRN

## 2023-06-01 MED ORDER — LIDOCAINE HCL (PF) 2 % IJ SOLN
INTRAMUSCULAR | Status: AC
  Filled 2023-06-01: qty 5

## 2023-06-01 MED ORDER — FENTANYL CITRATE (PF) 100 MCG/2ML IJ SOLN: 25.0000 ug | INTRAMUSCULAR | Status: DC | PRN

## 2023-06-01 MED ORDER — KETOROLAC TROMETHAMINE 30 MG/ML IJ SOLN
INTRAMUSCULAR | Status: AC
  Filled 2023-06-01: qty 1

## 2023-06-01 MED ORDER — DEXMEDETOMIDINE HCL IN NACL 80 MCG/20ML IV SOLN
INTRAVENOUS | Status: AC
  Filled 2023-06-01: qty 20

## 2023-06-01 MED ORDER — OXYCODONE HCL 5 MG PO TABS: 5.0000 mg | ORAL_TABLET | Freq: Once | ORAL | Status: DC | PRN

## 2023-06-01 MED ORDER — PROPOFOL 500 MG/50ML IV EMUL
INTRAVENOUS | Status: DC | PRN
Start: 2023-06-01 — End: 2023-06-01
  Administered 2023-06-01: 200 ug/kg/min via INTRAVENOUS

## 2023-06-01 MED ORDER — MIDAZOLAM HCL 5 MG/5ML IJ SOLN
INTRAMUSCULAR | Status: DC | PRN
  Administered 2023-06-01: 2 mg via INTRAVENOUS

## 2023-06-01 MED ORDER — SODIUM CHLORIDE 0.9 % IV SOLN
100.0000 mg | INTRAVENOUS | Status: DC
  Filled 2023-06-01: qty 100

## 2023-06-01 MED ORDER — ACETAMINOPHEN 160 MG/5ML PO SOLN: 325.0000 mg | ORAL | Status: DC | PRN

## 2023-06-01 MED ORDER — KETOROLAC TROMETHAMINE 30 MG/ML IJ SOLN
INTRAMUSCULAR | Status: DC | PRN
Start: 2023-06-01 — End: 2023-06-01
  Administered 2023-06-01: 30 mg via INTRAVENOUS

## 2023-06-01 MED ORDER — DEXAMETHASONE SODIUM PHOSPHATE 10 MG/ML IJ SOLN
INTRAMUSCULAR | Status: DC | PRN
  Administered 2023-06-01: 10 mg via INTRAVENOUS

## 2023-06-01 MED ORDER — MIDAZOLAM HCL 2 MG/2ML IJ SOLN
INTRAMUSCULAR | Status: AC
  Filled 2023-06-01: qty 2

## 2023-06-01 MED ORDER — FENTANYL CITRATE (PF) 100 MCG/2ML IJ SOLN
INTRAMUSCULAR | Status: DC | PRN
  Administered 2023-06-01: 100 ug via INTRAVENOUS

## 2023-06-01 MED ORDER — FENTANYL CITRATE (PF) 100 MCG/2ML IJ SOLN
INTRAMUSCULAR | Status: AC
  Filled 2023-06-01: qty 2

## 2023-06-01 MED ORDER — PROPOFOL 10 MG/ML IV BOLUS
INTRAVENOUS | Status: DC | PRN
  Administered 2023-06-01: 180 mg via INTRAVENOUS

## 2023-06-01 MED ORDER — LIDOCAINE 2% (20 MG/ML) 5 ML SYRINGE
INTRAMUSCULAR | Status: DC | PRN
  Administered 2023-06-01: 80 mg via INTRAVENOUS

## 2023-06-01 MED ORDER — ONDANSETRON HCL 4 MG/2ML IJ SOLN
INTRAMUSCULAR | Status: DC | PRN
  Administered 2023-06-01: 4 mg via INTRAVENOUS

## 2023-06-01 MED ORDER — DEXAMETHASONE SODIUM PHOSPHATE 10 MG/ML IJ SOLN
INTRAMUSCULAR | Status: AC
  Filled 2023-06-01: qty 1

## 2023-06-01 MED ORDER — PROPOFOL 1000 MG/100ML IV EMUL
INTRAVENOUS | Status: AC
  Filled 2023-06-01: qty 100

## 2023-06-01 MED ORDER — PROPOFOL 10 MG/ML IV BOLUS
INTRAVENOUS | Status: AC
  Filled 2023-06-01: qty 20

## 2023-06-01 MED ORDER — POVIDONE-IODINE 10 % EX SWAB: 2.0000 | Freq: Once | CUTANEOUS | Status: DC

## 2023-06-01 MED ORDER — SCOPOLAMINE 1 MG/3DAYS TD PT72: 1.0000 | MEDICATED_PATCH | TRANSDERMAL | Status: DC

## 2023-06-01 SURGICAL SUPPLY — 20 items
CATH ROBINSON RED A/P 16FR (CATHETERS) ×2 IMPLANT
DRSG TELFA 3X8 NADH STRL (GAUZE/BANDAGES/DRESSINGS) ×2 IMPLANT
FILTER UTR ASPR ASSEMBLY (MISCELLANEOUS) ×2 IMPLANT
GAUZE 4X4 16PLY ~~LOC~~+RFID DBL (SPONGE) ×2 IMPLANT
GLOVE BIOGEL PI IND STRL 7.0 (GLOVE) ×2 IMPLANT
GLOVE ECLIPSE 6.5 STRL STRAW (GLOVE) ×2 IMPLANT
GLOVE ECLIPSE 8.0 STRL XLNG CF (GLOVE) ×2 IMPLANT
GOWN STRL REUS W/TWL LRG LVL3 (GOWN DISPOSABLE) ×2 IMPLANT
HOSE CONNECTING 18IN BERKELEY (TUBING) ×2 IMPLANT
KIT BERKELEY 1ST TRIMESTER 3/8 (MISCELLANEOUS) ×8 IMPLANT
KIT TURNOVER CYSTO (KITS) ×2 IMPLANT
NS IRRIG 500ML POUR BTL (IV SOLUTION) ×2 IMPLANT
PACK VAGINAL MINOR WOMEN LF (CUSTOM PROCEDURE TRAY) ×2 IMPLANT
SLEEVE SCD COMPRESS KNEE MED (STOCKING) ×2 IMPLANT
TOWEL OR 17X24 6PK STRL BLUE (TOWEL DISPOSABLE) ×2 IMPLANT
VACURETTE 10 RIGID CVD (CANNULA) IMPLANT
VACURETTE 6 ASPIR F TIP BERK (CANNULA) IMPLANT
VACURETTE 7MM CVD STRL WRAP (CANNULA) IMPLANT
VACURETTE 8 RIGID CVD (CANNULA) IMPLANT
VACURETTE 9 RIGID CVD (CANNULA) IMPLANT

## 2023-06-01 NOTE — Interval H&P Note (Signed)
History and Physical Interval Note:  06/01/2023 7:23 AM  Patricia Knight  has presented today for surgery, with the diagnosis of missed abortion.  The various methods of treatment have been discussed with the patient and family. After consideration of risks, benefits and other options for treatment, the patient has consented to  Procedure(s): DILATATION AND EVACUATION (N/A) OPERATIVE ULTRASOUND (N/A) CHROMOSOME STUDIES (N/A) as a surgical intervention.  The patient's history has been reviewed, patient examined, no change in status, stable for surgery.  I have reviewed the patient's chart and labs.  Questions were answered to the patient's satisfaction.     Tateanna Bach A Latrina Guttman

## 2023-06-01 NOTE — Anesthesia Preprocedure Evaluation (Addendum)
Anesthesia Evaluation  Patient identified by MRN, date of birth, ID band Patient awake    Reviewed: Allergy & Precautions, NPO status , Patient's Chart, lab work & pertinent test results  Airway Mallampati: II  TM Distance: >3 FB Neck ROM: Full    Dental  (+) Teeth Intact, Dental Advisory Given   Pulmonary former smoker   breath sounds clear to auscultation       Cardiovascular negative cardio ROS  Rhythm:Regular Rate:Normal     Neuro/Psych   Anxiety     negative neurological ROS     GI/Hepatic Neg liver ROS,GERD  ,,  Endo/Other  negative endocrine ROS    Renal/GU negative Renal ROS     Musculoskeletal negative musculoskeletal ROS (+)    Abdominal   Peds  Hematology negative hematology ROS (+)   Anesthesia Other Findings   Reproductive/Obstetrics                             Anesthesia Physical Anesthesia Plan  ASA: 2  Anesthesia Plan: General   Post-op Pain Management: Tylenol PO (pre-op)* and Toradol IV (intra-op)*   Induction: Intravenous  PONV Risk Score and Plan: 4 or greater and Ondansetron, Dexamethasone, Midazolam, Scopolamine patch - Pre-op and TIVA  Airway Management Planned: LMA  Additional Equipment: None  Intra-op Plan:   Post-operative Plan: Extubation in OR  Informed Consent: I have reviewed the patients History and Physical, chart, labs and discussed the procedure including the risks, benefits and alternatives for the proposed anesthesia with the patient or authorized representative who has indicated his/her understanding and acceptance.     Dental advisory given  Plan Discussed with: CRNA  Anesthesia Plan Comments:        Anesthesia Quick Evaluation

## 2023-06-01 NOTE — Anesthesia Postprocedure Evaluation (Signed)
Anesthesia Post Note  Patient: Patricia Knight  Procedure(s) Performed: DILATATION AND EVACUATION (Uterus) OPERATIVE ULTRASOUND (Abdomen) CHROMOSOME STUDIES (Uterus)     Patient location during evaluation: PACU Anesthesia Type: General Level of consciousness: awake and alert Pain management: pain level controlled Vital Signs Assessment: post-procedure vital signs reviewed and stable Respiratory status: spontaneous breathing, nonlabored ventilation, respiratory function stable and patient connected to nasal cannula oxygen Cardiovascular status: blood pressure returned to baseline and stable Postop Assessment: no apparent nausea or vomiting Anesthetic complications: no  No notable events documented.  Last Vitals:  Vitals:   06/01/23 0845 06/01/23 0910  BP: 109/66 117/84  Pulse: (!) 59 63  Resp: 18 17  Temp:  36.7 C  SpO2: 100% 100%    Last Pain:  Vitals:   06/01/23 0910  TempSrc:   PainSc: 0-No pain                 Shelton Silvas

## 2023-06-01 NOTE — Brief Op Note (Signed)
06/01/2023  8:27 AM  PATIENT:  Patricia Knight  37 y.o. female  PRE-OPERATIVE DIAGNOSIS:  missed abortion, recurrent pregnancy loss  POST-OPERATIVE DIAGNOSIS:  missed abortion, recurrent pregnancy loss  PROCEDURE:  Ultrasound guided suction dilation and evacuation  with chromosomal abnormality  SURGEON:  Surgeons and Role:    * Maxie Better, MD - Primary  PHYSICIAN ASSISTANT:   ASSISTANTS: none   ANESTHESIA:   general  EBL:  10 ml  BLOOD ADMINISTERED:none  DRAINS: none   LOCAL MEDICATIONS USED:  NONE  SPECIMEN:  Source of Specimen:  POC  DISPOSITION OF SPECIMEN:  PATHOLOGY  COUNTS:  YES  TOURNIQUET:  * No tourniquets in log *  DICTATION: .Other Dictation: Dictation Number [id number]  PLAN OF CARE: Discharge to home after PACU  PATIENT DISPOSITION:  PACU - hemodynamically stable.   Delay start of Pharmacological VTE agent (>24hrs) due to surgical blood loss or risk of bleeding: no

## 2023-06-01 NOTE — Anesthesia Procedure Notes (Signed)
Procedure Name: LMA Insertion Date/Time: 06/01/2023 7:46 AM  Performed by: Inaya Gillham D, CRNAPre-anesthesia Checklist: Patient identified, Emergency Drugs available, Suction available and Patient being monitored Patient Re-evaluated:Patient Re-evaluated prior to induction Oxygen Delivery Method: Circle system utilized Preoxygenation: Pre-oxygenation with 100% oxygen Induction Type: IV induction Ventilation: Mask ventilation without difficulty LMA: LMA inserted LMA Size: 4.0 Tube type: Oral Number of attempts: 1 Placement Confirmation: positive ETCO2 and breath sounds checked- equal and bilateral Tube secured with: Tape Dental Injury: Teeth and Oropharynx as per pre-operative assessment

## 2023-06-01 NOTE — Discharge Instructions (Signed)
  Post Anesthesia Home Care Instructions  Activity: Get plenty of rest for the remainder of the day. A responsible individual must stay with you for 24 hours following the procedure.  For the next 24 hours, DO NOT: -Drive a car -Advertising copywriter -Drink alcoholic beverages -Take any medication unless instructed by your physician -Make any legal decisions or sign important papers.  Meals: Start with liquid foods such as gelatin or soup. Progress to regular foods as tolerated. Avoid greasy, spicy, heavy foods. If nausea and/or vomiting occur, drink only clear liquids until the nausea and/or vomiting subsides. Call your physician if vomiting continues.  Special Instructions/Symptoms: Your throat may feel dry or sore from the anesthesia or the breathing tube placed in your throat during surgery. If this causes discomfort, gargle with warm salt water. The discomfort should disappear within 24 hours.  If you had a scopolamine patch placed behind your ear for the management of post- operative nausea and/or vomiting:  1. The medication in the patch is effective for 72 hours, after which it should be removed.  Wrap patch in a tissue and discard in the trash. Wash hands thoroughly with soap and water. 2. You may remove the patch earlier than 72 hours if you experience unpleasant side effects which may include dry mouth, dizziness or visual disturbances. 3. Avoid touching the patch. Wash your hands with soap and water after contact with the patch.    Remove patch behind left ear by Friday, June 04, 2023.    [facility] Home care Instructions:   Personal hygiene:  Used sanitary napkins for vaginal drainage not tampons. Shower or tub bathe the day after your procedure. No douching until bleeding stops. Always wipe from front to back after  Elimination.  Activity: Do not drive or operate any equipment today. The effects of the anesthesia are still present and drowsiness may result. Rest today, not  necessarily flat bed rest, just take it easy. You may resume your normal activity in one to 2 days.  Sexual activity: No intercourse for one week or as indicated by your physician  Diet: Eat a light diet as desired this evening. You may resume a regular diet tomorrow.  Return to work: One to 2 days.  General Expectations of your surgery: Vaginal bleeding should be no heavier than a normal period. Spotting may continue up to 10 days. Mild cramps may continue for a couple of days. You may have a regular period in 2-6 weeks.  Unexpected observations call your doctor if these occur: persistent or heavy bleeding. Severe abdominal cramping or pain. Elevation of temperature greater than 100F.  Begin taking Ibuprofen at 2 PM as needed for cramping/soreness.

## 2023-06-01 NOTE — Op Note (Unsigned)
NAME: Patricia Knight, [place] MEDICAL RECORD NO: [id number] ACCOUNT NO: [id number] DATE OF BIRTH: April 12, 1986 FACILITY: [facility] LOCATION: [facility] PHYSICIAN: Thaison Kolodziejski A. Cherly Hensen, MD  Operative Report   DATE OF PROCEDURE: 06/01/2023  PREOPERATIVE DIAGNOSES:  Missed abortion, recurrent pregnancy loss.  PROCEDURE:  Ultrasound-guided suction dilation and evacuation with chromosomal analysis.  POSTOPERATIVE DIAGNOSES:  Missed abortion, recurrent pregnancy loss.  ANESTHESIA:  General.  SURGEON:  Adalin Vanderploeg A. Cherly Hensen, MD.  ASSISTANT:  None.  DESCRIPTION OF PROCEDURE:  Under adequate general anesthesia, the patient was placed in the dorsal lithotomy position.  She was sterilely prepped and draped in the usual fashion.  The bladder was not catheterized due to planned use of intraoperative ultrasound. An  exam under anesthesia revealed about 8 week size anteverted uterus.  Bivalve speculum was placed in the vagina.  A single-tooth tenaculum was placed on the anterior lip of the cervix. The cervix was serially dilated up to [facility] dilator.  A 10 mm  curved suction cannula was introduced under ultrasound guidance. The uterine cavity was evacuated of tissue.  Ring clamp was used to remove placental tissue.  The cavity was curetted gently at the end of the case, confirming tissue removal. When all  tissue was felt to have been removed, all instruments were then removed from the vagina.  Specimen labeled products of conception were sent to pathology, a portion went for microarray testing.   COMPLICATIONS:  None.   ESTIMATED BLOOD LOSS: 10 mL.  DISPOSITION:  The patient tolerated the procedure well and was transferred to recovery room in stable condition.      PAA D: 06/01/2023 8:26:27 am T: 06/01/2023 9:20:00 am  JOB: [id number]/ [id number]

## 2023-06-01 NOTE — Transfer of Care (Signed)
Immediate Anesthesia Transfer of Care Note  Patient: Patricia Knight  Procedure(s) Performed: DILATATION AND EVACUATION (Uterus) OPERATIVE ULTRASOUND (Abdomen) CHROMOSOME STUDIES (Uterus)  Patient Location: PACU  Anesthesia Type:General  Level of Consciousness: awake, alert , and oriented  Airway & Oxygen Therapy: Patient Spontanous Breathing and Patient connected to nasal cannula oxygen  Post-op Assessment: Report given to RN and Post -op Vital signs reviewed and stable  Post vital signs: Reviewed and stable  Last Vitals:  Vitals Value Taken Time  BP 112/75 06/01/23 0815  Temp    Pulse 76 06/01/23 0815  Resp 10 06/01/23 0815  SpO2 100 % 06/01/23 0815    Last Pain:  Vitals:   06/01/23 0616  TempSrc: Oral  PainSc: 0-No pain      Patients Stated Pain Goal: 3 (06/01/23 0616)  Complications: No notable events documented.

## 2023-06-02 ENCOUNTER — Encounter (HOSPITAL_BASED_OUTPATIENT_CLINIC_OR_DEPARTMENT_OTHER): Payer: Self-pay | Admitting: Obstetrics and Gynecology

## 2023-06-02 LAB — SURGICAL PATHOLOGY

## 2023-06-16 ENCOUNTER — Other Ambulatory Visit: Payer: Self-pay | Admitting: Obstetrics and Gynecology

## 2023-06-16 DIAGNOSIS — E049 Nontoxic goiter, unspecified: Secondary | ICD-10-CM

## 2023-06-18 ENCOUNTER — Ambulatory Visit
Admission: RE | Admit: 2023-06-18 | Discharge: 2023-06-18 | Disposition: A | Discharge status: Home / Self Care | Payer: PRIVATE HEALTH INSURANCE | Source: Ambulatory Visit | Attending: Obstetrics and Gynecology | Admitting: Obstetrics and Gynecology

## 2023-06-18 DIAGNOSIS — E049 Nontoxic goiter, unspecified: Secondary | ICD-10-CM

## 2023-08-06 ENCOUNTER — Encounter: Payer: Self-pay | Admitting: Plastic Surgery

## 2023-08-06 ENCOUNTER — Ambulatory Visit (INDEPENDENT_AMBULATORY_CARE_PROVIDER_SITE_OTHER): Payer: Self-pay | Admitting: Plastic Surgery

## 2023-08-06 VITALS — BP 110/78 | HR 74 | Ht 63.0 in | Wt 125.0 lb

## 2023-08-06 DIAGNOSIS — Z719 Counseling, unspecified: Secondary | ICD-10-CM | POA: Insufficient documentation

## 2023-08-06 NOTE — Progress Notes (Signed)
Filler Injection Procedure Note  Procedure:  Filler administration  Pre-operative Diagnosis: Rytides   Post-operative Diagnosis: Same  Surgeon: Electronically signed by: Wayland Denis, DO   Complications:  None  Brief history: The patient desires injection with fillers in her face. I discussed with the patient this proposed procedure of filler injections, which is customized depending on the particular needs of the patient. It is performed on facial volume loss as a temporary correction. The alternatives were discussed with the patient. The risks were addressed including bleeding, scarring, infection, damage to deeper structures, asymmetry, and chronic pain, which may occur infrequently after a procedure. The individual's choice to undergo a surgical procedure is based on the comparison of risks to potential benefits. Other risks include unsatisfactory results, allergic reaction, which should go away with time, bruising and delayed healing. Fillers do not arrest the aging process or produce permanent tightening.  Operative intervention maybe necessary to maintain the results. The patient understands and wishes to proceed. An informed consent was signed and informational brochures given to her prior to the procedure.  Procedure: The area was prepped with chlorhexidine and dried with a clean gauze. Using a clean technique, a 30 gauge needle and then a cannula was then used to inject the filler into the upper lip. This was done with 0.6 cc. No complications were noted. Light pressure was held for 5 minutes. She was instructed explicitly in post-operative care.  Kysse 1 ml LOT: [id number]

## 2023-10-26 ENCOUNTER — Ambulatory Visit (INDEPENDENT_AMBULATORY_CARE_PROVIDER_SITE_OTHER): Payer: Self-pay | Admitting: Surgical

## 2023-10-26 DIAGNOSIS — Z719 Counseling, unspecified: Secondary | ICD-10-CM

## 2023-10-26 NOTE — Progress Notes (Signed)
 Preoperative Dx: rosacea  Postoperative Dx:  same  Procedure: laser to face   Anesthesia: none  Description of Procedure:  Risks and complications were explained to the patient. Consent was confirmed and signed. Time out was called and all information was confirmed to be correct. The area  area was prepped with alcohol and wiped dry. The BBL laser was used.         The patient tolerated the procedure well and there were no complications. The patient is to follow up in 4 weeks.

## 2023-10-29 ENCOUNTER — Other Ambulatory Visit: Payer: PRIVATE HEALTH INSURANCE | Admitting: Physician Assistant

## 2023-11-26 ENCOUNTER — Ambulatory Visit: Payer: Self-pay | Admitting: Surgical

## 2023-11-26 DIAGNOSIS — Z719 Counseling, unspecified: Secondary | ICD-10-CM

## 2023-11-26 NOTE — Progress Notes (Signed)
 Preoperative Dx: hyperpigmentation, rosacea  Postoperative Dx:  same  Procedure: laser to face   Anesthesia: none  Description of Procedure:  Risks and complications were explained to the patient. Consent was confirmed and signed. Time out was called and all information was confirmed to be correct. The area  area was prepped with alcohol and wiped dry.  The patient tolerated the procedure well and there were no complications. The patient is to follow up in 4 weeks.

## 2023-12-24 ENCOUNTER — Other Ambulatory Visit: Payer: Self-pay | Admitting: Surgical

## 2024-01-14 ENCOUNTER — Ambulatory Visit (INDEPENDENT_AMBULATORY_CARE_PROVIDER_SITE_OTHER): Payer: Self-pay | Admitting: Surgical

## 2024-01-14 DIAGNOSIS — Z719 Counseling, unspecified: Secondary | ICD-10-CM

## 2024-01-14 NOTE — Progress Notes (Signed)
 Preoperative Dx: Hyperpigmentation/sun damage and rosacea  Postoperative Dx:  same  Procedure: laser to the's  Anesthesia: none  Description of Procedure:  Risks and complications were explained to the patient. Consent was confirmed and signed. Time out was called and all information was confirmed to be correct. The area  area was prepped with alcohol and wiped dry. The BBL laser was used. The face was lasered. The patient tolerated the procedure well and there were no complications. The patient is to follow up in 4 weeks.
